# Patient Record
Sex: Female | Born: 1935 | Race: White | Hispanic: No | Marital: Married | Smoking: Never smoker
Health system: Southern US, Community
[De-identification: ages and names within clinical notes are randomized; demographics above are authoritative.]

## PROBLEM LIST (undated history)

## (undated) DIAGNOSIS — M1990 Unspecified osteoarthritis, unspecified site: Secondary | ICD-10-CM

## (undated) DIAGNOSIS — C801 Malignant (primary) neoplasm, unspecified: Secondary | ICD-10-CM

## (undated) DIAGNOSIS — N390 Urinary tract infection, site not specified: Secondary | ICD-10-CM

## (undated) HISTORY — PX: BREAST SURGERY: SHX581

## (undated) HISTORY — PX: OTHER SURGICAL HISTORY: SHX169

## (undated) HISTORY — PX: ABDOMINAL HYSTERECTOMY: SHX81

---

## 2006-11-04 ENCOUNTER — Ambulatory Visit: Payer: Self-pay | Admitting: Oncology

## 2011-06-22 HISTORY — PX: HIP FRACTURE SURGERY: SHX118

## 2012-06-21 HISTORY — PX: PARATHYROIDECTOMY: SHX19

## 2013-06-20 ENCOUNTER — Other Ambulatory Visit: Payer: Self-pay | Admitting: Physician Assistant

## 2013-06-20 NOTE — H&P (Signed)
TOTAL HIP ADMISSION H&P  Patient is admitted for left total hip arthroplasty.  Subjective:  Chief Complaint: left hip pain  HPI: Hannah Schwartz, 79 y.o. female, has a history of pain and functional disability in the left hip(s) due to arthritis and patient has failed non-surgical conservative treatments for greater than 12 weeks to include NSAID's and/or analgesics, use of assistive devices and activity modification.  Onset of symptoms was gradual starting 8 years ago with gradually worsening course since that time.The patient noted no past surgery on the left hip(s).  Patient currently rates pain in the left hip at 10 out of 10 with activity. Patient has night pain, worsening of pain with activity and weight bearing, trendelenberg gait, pain that interfers with activities of daily living and pain with passive range of motion. Patient has evidence of periarticular osteophytes and joint space narrowing by imaging studies. This condition presents safety issues increasing the risk of falls.  There is no current active infection.  There are no active problems to display for this patient.  No past medical history on file.  No past surgical history on file.   (Not in a hospital admission) Allergies not on file  History  Substance Use Topics  . Smoking status: Not on file  . Smokeless tobacco: Not on file  . Alcohol Use: Not on file    No family history on file.   Review of Systems  Constitutional: Negative.   HENT: Positive for hearing loss. Negative for tinnitus.   Eyes: Negative.   Respiratory: Negative.   Cardiovascular: Negative.   Gastrointestinal: Negative.   Genitourinary: Negative.   Musculoskeletal: Positive for joint pain.  Skin: Negative.   Neurological: Negative.  Negative for headaches.  Endo/Heme/Allergies: Negative.   Psychiatric/Behavioral: Negative.     Objective:  Physical Exam  Constitutional: She is oriented to person, place, and time. She appears well-developed  and well-nourished.  HENT:  Head: Normocephalic and atraumatic.  Eyes: EOM are normal. Pupils are equal, round, and reactive to light.  Neck: Normal range of motion. Neck supple.  Cardiovascular: Normal rate, regular rhythm, normal heart sounds and intact distal pulses.  Exam reveals no gallop and no friction rub.   No murmur heard. Respiratory: Effort normal and breath sounds normal. No respiratory distress. She has no wheezes. She has no rales.  GI: Soft. Bowel sounds are normal. She exhibits no distension. There is no tenderness. There is no rebound.  Musculoskeletal:  Positive log roll markedly decreased rotation left hip. No greater trochanteric soreness. She is neurovascularly intact distally.  Neurological: She is alert and oriented to person, place, and time.  Skin: Skin is warm and dry.  Psychiatric: She has a normal mood and affect. Her behavior is normal. Judgment and thought content normal.    Vital signs in last 24 hours: @VSRANGES @  Labs:   There is no height or weight on file to calculate BMI.   Imaging Review Plain radiographs demonstrate severe degenerative joint disease of the left hip(s). The bone quality appears to be fair for age and reported activity level.  Assessment/Plan:  End stage arthritis, left hip(s)  The patient history, physical examination, clinical judgement of the provider and imaging studies are consistent with end stage degenerative joint disease of the left hip(s) and total hip arthroplasty is deemed medically necessary. The treatment options including medical management, injection therapy, arthroscopy and arthroplasty were discussed at length. The risks and benefits of total hip arthroplasty were presented and reviewed. The risks  due to aseptic loosening, infection, stiffness, dislocation/subluxation,  thromboembolic complications and other imponderables were discussed.  The patient acknowledged the explanation, agreed to proceed with the plan  and consent was signed. Patient is being admitted for inpatient treatment for surgery, pain control, PT, OT, prophylactic antibiotics, VTE prophylaxis, progressive ambulation and ADL's and discharge planning.The patient is planning to be discharged home with home health services

## 2013-06-26 ENCOUNTER — Encounter (HOSPITAL_COMMUNITY)
Admission: RE | Admit: 2013-06-26 | Discharge: 2013-06-26 | Disposition: A | Discharge status: Home / Self Care | Payer: Medicare Other | Source: Ambulatory Visit | Attending: Physician Assistant | Admitting: Physician Assistant

## 2013-06-26 ENCOUNTER — Encounter (HOSPITAL_COMMUNITY): Payer: Self-pay

## 2013-06-26 ENCOUNTER — Encounter (HOSPITAL_COMMUNITY)
Admission: RE | Admit: 2013-06-26 | Discharge: 2013-06-26 | Disposition: A | Discharge status: Home / Self Care | Payer: Medicare Other | Source: Ambulatory Visit | Attending: Orthopedic Surgery | Admitting: Orthopedic Surgery

## 2013-06-26 DIAGNOSIS — Z01812 Encounter for preprocedural laboratory examination: Secondary | ICD-10-CM | POA: Insufficient documentation

## 2013-06-26 DIAGNOSIS — Z01810 Encounter for preprocedural cardiovascular examination: Secondary | ICD-10-CM | POA: Insufficient documentation

## 2013-06-26 DIAGNOSIS — Z01818 Encounter for other preprocedural examination: Secondary | ICD-10-CM | POA: Insufficient documentation

## 2013-06-26 DIAGNOSIS — Z01811 Encounter for preprocedural respiratory examination: Secondary | ICD-10-CM | POA: Insufficient documentation

## 2013-06-26 HISTORY — DX: Malignant (primary) neoplasm, unspecified: C80.1

## 2013-06-26 HISTORY — DX: Unspecified osteoarthritis, unspecified site: M19.90

## 2013-06-26 HISTORY — DX: Urinary tract infection, site not specified: N39.0

## 2013-06-26 LAB — SURGICAL PCR SCREEN
MRSA, PCR: NEGATIVE
Staphylococcus aureus: NEGATIVE

## 2013-06-26 LAB — CBC WITH DIFFERENTIAL/PLATELET
Basophils Absolute: 0 10*3/uL (ref 0.0–0.1)
Basophils Relative: 0 % (ref 0–1)
Eosinophils Absolute: 0.1 10*3/uL (ref 0.0–0.7)
Eosinophils Relative: 1 % (ref 0–5)
HEMATOCRIT: 37.3 % (ref 36.0–46.0)
Hemoglobin: 12.3 g/dL (ref 12.0–15.0)
LYMPHS PCT: 29 % (ref 12–46)
Lymphs Abs: 2.1 10*3/uL (ref 0.7–4.0)
MCH: 31.3 pg (ref 26.0–34.0)
MCHC: 33 g/dL (ref 30.0–36.0)
MCV: 94.9 fL (ref 78.0–100.0)
MONO ABS: 0.6 10*3/uL (ref 0.1–1.0)
Monocytes Relative: 8 % (ref 3–12)
Neutro Abs: 4.6 10*3/uL (ref 1.7–7.7)
Neutrophils Relative %: 63 % (ref 43–77)
Platelets: 303 10*3/uL (ref 150–400)
RBC: 3.93 MIL/uL (ref 3.87–5.11)
RDW: 13.2 % (ref 11.5–15.5)
WBC: 7.3 10*3/uL (ref 4.0–10.5)

## 2013-06-26 LAB — COMPREHENSIVE METABOLIC PANEL
ALT: 11 U/L (ref 0–35)
AST: 22 U/L (ref 0–37)
Albumin: 4.1 g/dL (ref 3.5–5.2)
Alkaline Phosphatase: 62 U/L (ref 39–117)
BUN: 12 mg/dL (ref 6–23)
CALCIUM: 9.8 mg/dL (ref 8.4–10.5)
CO2: 29 meq/L (ref 19–32)
Chloride: 99 mEq/L (ref 96–112)
Creatinine, Ser: 0.68 mg/dL (ref 0.50–1.10)
GFR calc non Af Amer: 80 mL/min — ABNORMAL LOW (ref >90)
GLUCOSE: 101 mg/dL — AB (ref 70–99)
Potassium: 5.9 mEq/L — ABNORMAL HIGH (ref 3.7–5.3)
Sodium: 141 mEq/L (ref 137–147)
Total Bilirubin: <0.2 mg/dL — ABNORMAL LOW (ref 0.3–1.2)
Total Protein: 7.3 g/dL (ref 6.0–8.3)

## 2013-06-26 LAB — URINALYSIS, ROUTINE W REFLEX MICROSCOPIC
Bilirubin Urine: NEGATIVE
Glucose, UA: NEGATIVE mg/dL
Ketones, ur: NEGATIVE mg/dL
Leukocytes, UA: NEGATIVE
Nitrite: NEGATIVE
PROTEIN: NEGATIVE mg/dL
Specific Gravity, Urine: 1.009 (ref 1.005–1.030)
Urobilinogen, UA: 0.2 mg/dL (ref 0.0–1.0)
pH: 6.5 (ref 5.0–8.0)

## 2013-06-26 LAB — URINE MICROSCOPIC-ADD ON

## 2013-06-26 LAB — APTT: aPTT: 28 seconds (ref 24–37)

## 2013-06-26 LAB — ABO/RH: ABO/RH(D): A POS

## 2013-06-26 LAB — TYPE AND SCREEN
ABO/RH(D): A POS
Antibody Screen: NEGATIVE

## 2013-06-26 LAB — PROTIME-INR
INR: 0.89 (ref 0.00–1.49)
PROTHROMBIN TIME: 11.9 s (ref 11.6–15.2)

## 2013-06-26 NOTE — Pre-Procedure Instructions (Addendum)
ALEENE SWANNER  06/26/2013   Your procedure is scheduled on:  07/04/13  Report to [facility] cone short stay admitting at 73 AM.  Call this number if you have problems the morning of surgery: [phone]   Remember:   Do not eat food or drink liquids after midnight.   Take these medicines the morning of surgery with A SIP OF WATER: none   Do not wear jewelry, make-up or nail polish.  Do not wear lotions, powders, or perfumes. You may wear deodorant.  Do not shave 48 hours prior to surgery. Men may shave face and neck.  Do not bring valuables to the hospital.  [facility] is not responsible                  for any belongings or valuables.               Contacts, dentures or bridgework may not be worn into surgery.  Leave suitcase in the car. After surgery it may be brought to your room.  For patients admitted to the hospital, discharge time is determined by your                treatment team.               Patients discharged the day of surgery will not be allowed to drive  home.  Name and phone number of your driver:   Special Instructions: Incentive Spirometry - Practice and bring it with you on the day of surgery. Shower using CHG 2 nights before surgery and the night before surgery.  If you shower the day of surgery use CHG.  Use special wash - you have one bottle of CHG for all showers.  You should use approximately 1/3 of the bottle for each shower.   Please read over the following fact sheets that you were given: Pain Booklet, Coughing and Deep Breathing, Blood Transfusion Information, Total Joint Packet, MRSA Information and Surgical Site Infection Prevention

## 2013-06-26 NOTE — Progress Notes (Signed)
req'd ekg, ?stress?echo from [place] cardiology dr Bettina Gavia  ?06

## 2013-06-27 ENCOUNTER — Encounter (HOSPITAL_COMMUNITY): Payer: Self-pay | Admitting: Vascular Surgery

## 2013-06-27 NOTE — Progress Notes (Signed)
Anesthesia Chart Review:  Patient is a 80 year old female scheduled for left THA on 07/04/13 by Dr. Kathryne Hitch.  History includes uterine cancer s/p hysterectomy, parathyroidectomy, arthritis. PCP is listed as Dr. Salvadore Oxford.  On 05/17/11 she had a negative stress echo at [facility]) in [place] (done for palpitations).  EKG on 06/26/13 on showed SR with first degree AVB, incomplete right BBB, cannot rule out anterior infarct (age undetermined).  I think her EKG appears stable when compared to prior EKGs from 02/25/11 and 05/01/12 [facility]).  CXR on 06/26/13 showed no active cardiopulmonary disease.  Preoperative labs noted.  K+ 5.9, Cr 0.68. There is no mention of specimen being hemolyzed.  CBC and PT/PTT WNL.  Urine culture is pending.  ISTAT on arrival to re-evaluate for hyperkalemia.  If hyperkalemia stable or improved then I would anticipate that she could proceed as planned. Dr. Debroah Loop PA Ria Comment notified.  [facility]/Anesthesiology Phone [phone] 06/28/2013 4:29 PM

## 2013-06-28 ENCOUNTER — Other Ambulatory Visit: Payer: Self-pay | Admitting: Physician Assistant

## 2013-06-29 LAB — URINE CULTURE: Colony Count: >=100000

## 2013-06-29 NOTE — Progress Notes (Signed)
Message left with the office of Dr Percell Miller and they Judeen Hammans) stated pt is going to be canceled due to need for cardiac workup, abnormal potassium and urine culture.

## 2013-07-04 ENCOUNTER — Encounter (HOSPITAL_COMMUNITY): Admission: RE | Payer: Self-pay | Source: Ambulatory Visit

## 2013-07-04 ENCOUNTER — Inpatient Hospital Stay (HOSPITAL_COMMUNITY): Admission: RE | Admit: 2013-07-04 | Payer: Medicare Other | Source: Ambulatory Visit | Admitting: Orthopedic Surgery

## 2013-07-04 SURGERY — ARTHROPLASTY, HIP, TOTAL,POSTERIOR APPROACH
Anesthesia: General | Site: Hip | Laterality: Left

## 2013-07-05 ENCOUNTER — Other Ambulatory Visit: Payer: Self-pay | Admitting: Physician Assistant

## 2013-07-06 ENCOUNTER — Other Ambulatory Visit: Payer: Self-pay | Admitting: Physician Assistant

## 2013-07-06 NOTE — H&P (Signed)
TOTAL HIP ADMISSION H&P  Patient is admitted for left total hip arthroplasty.  Subjective:  Chief Complaint: left hip pain  HPI: Hannah Schwartz, 80 y.o. female, has a history of pain and functional disability in the left hip(s) due to arthritis and patient has failed non-surgical conservative treatments for greater than 12 weeks to include NSAID's and/or analgesics, use of assistive devices and activity modification.  Onset of symptoms was gradual starting 5 years ago with gradually worsening course since that time.The patient noted no past surgery on the left hip(s).  Patient currently rates pain in the left hip at 6 out of 10 with activity. Patient has night pain, worsening of pain with activity and weight bearing, trendelenberg gait and pain that interfers with activities of daily living. Patient has evidence of periarticular osteophytes and joint space narrowing by imaging studies. This condition presents safety issues increasing the risk of falls.  There is no current active infection.  There are no active problems to display for this patient.  Past Medical History  Diagnosis Date  . UTI (lower urinary tract infection)   . Cancer     uterine  . Arthritis     Past Surgical History  Procedure Laterality Date  . Abdominal hysterectomy    . Parathyroidectomy  16  . Hip fracture surgery Right 13    split-scews  . Breast surgery Right     bx  . Hydraditaform       (Not in a hospital admission) Allergies  Allergen Reactions  . Bactrim [Sulfamethoxazole-Tmp Ds] Itching  . Citalopram Itching  . Sulfa Antibiotics   . Codeine Nausea Only    History  Substance Use Topics  . Smoking status: Never Smoker   . Smokeless tobacco: Not on file  . Alcohol Use: No    No family history on file.   Review of Systems  Constitutional: Negative.   HENT: Negative.   Eyes: Negative.   Respiratory: Negative.   Cardiovascular: Negative.   Gastrointestinal: Negative.   Genitourinary:  Negative.   Musculoskeletal: Positive for joint pain.  Skin: Negative.   Neurological: Negative.   Endo/Heme/Allergies: Negative.   Psychiatric/Behavioral: Negative.     Objective:  Physical Exam  Constitutional: She is oriented to person, place, and time. She appears well-developed and well-nourished.  HENT:  Head: Normocephalic and atraumatic.  Eyes: EOM are normal. Pupils are equal, round, and reactive to light.  Neck: Neck supple.  Cardiovascular: Normal rate, regular rhythm and normal heart sounds.  Exam reveals no gallop and no friction rub.   No murmur heard. Respiratory: Effort normal and breath sounds normal. No respiratory distress. She has no wheezes. She has no rales.  GI: Soft. Bowel sounds are normal.  Musculoskeletal:  Examination of her left hip reveals an antalgic gait, Trendelenburg component on the left.  Negative straight leg raise.  Positive log roll.  Markedly decreased rotation left hip.  No trochanteric soreness on the left.  She is neurovascularly intact distally.    Neurological: She is alert and oriented to person, place, and time.  Skin: Skin is warm and dry.  Psychiatric: She has a normal mood and affect. Her behavior is normal. Judgment and thought content normal.    Vital signs in last 24 hours: @VSRANGES@  Labs:   Estimated body mass index is 19.87 kg/(m^2) as calculated from the following:   Height as of 06/26/13: 5' 5" (1.651 m).   Weight as of 06/26/13: 54.159 kg (119 lb 6.4 oz).     Imaging Review Plain radiographs demonstrate severe degenerative joint disease of the left hip(s). The bone quality appears to be fair for age and reported activity level.  Assessment/Plan:  End stage arthritis, left hip(s)  The patient history, physical examination, clinical judgement of the provider and imaging studies are consistent with end stage degenerative joint disease of the left hip(s) and total hip arthroplasty is deemed medically necessary. The  treatment options including medical management, injection therapy, arthroscopy and arthroplasty were discussed at length. The risks and benefits of total hip arthroplasty were presented and reviewed. The risks due to aseptic loosening, infection, stiffness, dislocation/subluxation,  thromboembolic complications and other imponderables were discussed.  The patient acknowledged the explanation, agreed to proceed with the plan and consent was signed. Patient is being admitted for inpatient treatment for surgery, pain control, PT, OT, prophylactic antibiotics, VTE prophylaxis, progressive ambulation and ADL's and discharge planning.The patient is planning to be discharged home with home health services

## 2013-07-10 ENCOUNTER — Other Ambulatory Visit: Payer: Self-pay | Admitting: Physician Assistant

## 2013-07-17 ENCOUNTER — Encounter (HOSPITAL_COMMUNITY): Payer: Self-pay

## 2013-07-17 ENCOUNTER — Other Ambulatory Visit (HOSPITAL_COMMUNITY): Payer: Medicare Other

## 2013-07-17 ENCOUNTER — Encounter (HOSPITAL_COMMUNITY)
Admission: RE | Admit: 2013-07-17 | Discharge: 2013-07-17 | Disposition: A | Discharge status: Home / Self Care | Payer: Medicare Other | Source: Ambulatory Visit | Attending: Orthopedic Surgery | Admitting: Orthopedic Surgery

## 2013-07-17 DIAGNOSIS — Z01812 Encounter for preprocedural laboratory examination: Secondary | ICD-10-CM | POA: Insufficient documentation

## 2013-07-17 DIAGNOSIS — Z01818 Encounter for other preprocedural examination: Secondary | ICD-10-CM | POA: Insufficient documentation

## 2013-07-17 LAB — URINALYSIS, ROUTINE W REFLEX MICROSCOPIC
Bilirubin Urine: NEGATIVE
GLUCOSE, UA: NEGATIVE mg/dL
Ketones, ur: NEGATIVE mg/dL
Leukocytes, UA: NEGATIVE
Nitrite: NEGATIVE
Protein, ur: NEGATIVE mg/dL
SPECIFIC GRAVITY, URINE: 1.013 (ref 1.005–1.030)
UROBILINOGEN UA: 0.2 mg/dL (ref 0.0–1.0)
pH: 7.5 (ref 5.0–8.0)

## 2013-07-17 LAB — TYPE AND SCREEN
ABO/RH(D): A POS
Antibody Screen: NEGATIVE

## 2013-07-17 LAB — COMPREHENSIVE METABOLIC PANEL
ALK PHOS: 69 U/L (ref 39–117)
ALT: 14 U/L (ref 0–35)
AST: 18 U/L (ref 0–37)
Albumin: 4 g/dL (ref 3.5–5.2)
BUN: 13 mg/dL (ref 6–23)
CHLORIDE: 99 meq/L (ref 96–112)
CO2: 28 mEq/L (ref 19–32)
Calcium: 9.7 mg/dL (ref 8.4–10.5)
Creatinine, Ser: 0.69 mg/dL (ref 0.50–1.10)
GFR calc non Af Amer: 80 mL/min — ABNORMAL LOW (ref >90)
GLUCOSE: 94 mg/dL (ref 70–99)
Potassium: 4.9 mEq/L (ref 3.7–5.3)
Sodium: 141 mEq/L (ref 137–147)
Total Bilirubin: <0.2 mg/dL — ABNORMAL LOW (ref 0.3–1.2)
Total Protein: 7.5 g/dL (ref 6.0–8.3)

## 2013-07-17 LAB — CBC WITH DIFFERENTIAL/PLATELET
Basophils Absolute: 0 10*3/uL (ref 0.0–0.1)
Basophils Relative: 1 % (ref 0–1)
EOS PCT: 1 % (ref 0–5)
Eosinophils Absolute: 0.1 10*3/uL (ref 0.0–0.7)
HCT: 36.1 % (ref 36.0–46.0)
Hemoglobin: 12.3 g/dL (ref 12.0–15.0)
LYMPHS ABS: 2 10*3/uL (ref 0.7–4.0)
LYMPHS PCT: 31 % (ref 12–46)
MCH: 31.6 pg (ref 26.0–34.0)
MCHC: 34.1 g/dL (ref 30.0–36.0)
MCV: 92.8 fL (ref 78.0–100.0)
Monocytes Absolute: 0.4 10*3/uL (ref 0.1–1.0)
Monocytes Relative: 7 % (ref 3–12)
NEUTROS PCT: 61 % (ref 43–77)
Neutro Abs: 3.9 10*3/uL (ref 1.7–7.7)
PLATELETS: 279 10*3/uL (ref 150–400)
RBC: 3.89 MIL/uL (ref 3.87–5.11)
RDW: 12.7 % (ref 11.5–15.5)
WBC: 6.3 10*3/uL (ref 4.0–10.5)

## 2013-07-17 LAB — URINE MICROSCOPIC-ADD ON

## 2013-07-17 LAB — APTT: aPTT: 29 seconds (ref 24–37)

## 2013-07-17 LAB — PROTIME-INR
INR: 0.97 (ref 0.00–1.49)
Prothrombin Time: 12.7 seconds (ref 11.6–15.2)

## 2013-07-17 NOTE — Pre-Procedure Instructions (Signed)
Hannah Schwartz  07/17/2013   Your procedure is scheduled on:  Wednesday February 4 th at 1330 Pm  Report to [facility] Main Entrance "A"at  ![phone].  Call this number if you have problems the morning of surgery: [phone]   Remember:   Do not eat food or drink liquids after midnight.   Take these medicines the morning of surgery with A SIP OF WATER: Tylenol if needed Stop Aspirin, Vitamins, Herbal medication, and NSAIDS (Naproxen, Ibuprofen, Advil)  Do not wear jewelry, make-up or nail polish.  Do not wear lotions, powders, or perfumes. You may wear deodorant.  Do not shave 48 hours prior to surgery. Men may shave face and neck.  Do not bring valuables to the hospital.  [facility] is not responsible  for any belongings or valuables.               Contacts, dentures or bridgework may not be worn into surgery.  Leave suitcase in the car. After surgery it may be brought to your room.  For patients admitted to the hospital, discharge time is determined by your  treatment team.               Patients discharged the day of surgery will not be allowed to drive home.    Special Instructions: Incentive Spirometry - Practice and bring it with you on the day of surgery. Shower using CHG 2 nights before surgery and the night before surgery.  If you shower the day of surgery use CHG.  Use special wash - you have one bottle of CHG for all showers.  You should use approximately 1/3 of the bottle for each shower.   Please read over the following fact sheets that you were given: Pain Booklet, Coughing and Deep Breathing, Blood Transfusion Information, MRSA Information and Surgical Site Infection Prevention

## 2013-07-18 LAB — URINE CULTURE

## 2013-07-18 NOTE — Progress Notes (Signed)
Anesthesia Chart Review: Patient is a 80 year old female scheduled for left THA on 07/25/13 by Dr. Kathryne Hitch. Procedure was initially scheduled for 07/04/13, but was postponed (? Due to hyperkalemia and apparently referral to cardiology).    History includes uterine cancer s/p hysterectomy, parathyroidectomy, arthritis. PCP is listed as Dr. Salvadore Oxford. She was seen by cardiologist Dr. Jimmie Molly with [facility] on 07/04/13 and felt to be low risk from a cardiac standpoint.  PRN cardiology follow-up was recommended.  Echo on 02/22/12 [facility]) showed grossly normal LV size and systolic function, mild AV sclerosis without stenosis, trace to mild AR, trace MR, trace TR.  On 05/17/11 she had a negative stress echo at [facility]) in [place] (done for palpitations).   EKG on 07/04/13 on showed SR with first degree AVB, RSR (V1), non-diagnostic.   CXR on 06/26/13 showed no active cardiopulmonary disease.   Preoperative labs noted. Urine culture is still pending.  Anticipate that she can proceed as planned.  [facility] Stay [facility]/Anesthesiology Phone [phone] 07/18/2013 11:39 AM

## 2013-07-24 MED ORDER — CHLORHEXIDINE GLUCONATE 4 % EX LIQD: 60.0000 mL | Freq: Once | CUTANEOUS | Status: DC

## 2013-07-24 MED ORDER — LACTATED RINGERS IV SOLN
INTRAVENOUS | Status: DC
  Administered 2013-07-25 (×2): via INTRAVENOUS

## 2013-07-24 MED ORDER — LACTATED RINGERS IV SOLN: INTRAVENOUS | Status: DC

## 2013-07-24 MED ORDER — CEFAZOLIN SODIUM-DEXTROSE 2-3 GM-% IV SOLR: 2.0000 g | INTRAVENOUS | Status: DC

## 2013-07-24 MED ORDER — CEFAZOLIN SODIUM-DEXTROSE 2-3 GM-% IV SOLR
2.0000 g | INTRAVENOUS | Status: AC
  Administered 2013-07-25: 2 g via INTRAVENOUS
  Filled 2013-07-24: qty 50

## 2013-07-24 NOTE — Progress Notes (Signed)
Message left for pt. Regarding time change,need to arrive at 11:00AM. Requested pt. Call back to confirm message received.

## 2013-07-25 ENCOUNTER — Inpatient Hospital Stay (HOSPITAL_COMMUNITY): Payer: Medicare Other | Admitting: Anesthesiology

## 2013-07-25 ENCOUNTER — Inpatient Hospital Stay (HOSPITAL_COMMUNITY): Payer: Medicare Other

## 2013-07-25 ENCOUNTER — Encounter (HOSPITAL_COMMUNITY)
Admission: RE | Disposition: A | Discharge status: Home Health | Payer: Self-pay | Source: Ambulatory Visit | Attending: Orthopedic Surgery

## 2013-07-25 ENCOUNTER — Encounter (HOSPITAL_COMMUNITY): Payer: Medicare Other | Admitting: Vascular Surgery

## 2013-07-25 ENCOUNTER — Inpatient Hospital Stay (HOSPITAL_COMMUNITY)
Admission: RE | Admit: 2013-07-25 | Discharge: 2013-07-27 | DRG: 470 | Disposition: A | Discharge status: Home Health | Payer: Medicare Other | Source: Ambulatory Visit | Attending: Orthopedic Surgery | Admitting: Orthopedic Surgery

## 2013-07-25 ENCOUNTER — Encounter (HOSPITAL_COMMUNITY): Payer: Self-pay | Admitting: *Deleted

## 2013-07-25 DIAGNOSIS — Z888 Allergy status to other drugs, medicaments and biological substances status: Secondary | ICD-10-CM

## 2013-07-25 DIAGNOSIS — M1612 Unilateral primary osteoarthritis, left hip: Secondary | ICD-10-CM

## 2013-07-25 DIAGNOSIS — Z8542 Personal history of malignant neoplasm of other parts of uterus: Secondary | ICD-10-CM

## 2013-07-25 DIAGNOSIS — Z882 Allergy status to sulfonamides status: Secondary | ICD-10-CM

## 2013-07-25 DIAGNOSIS — Z7982 Long term (current) use of aspirin: Secondary | ICD-10-CM

## 2013-07-25 DIAGNOSIS — Z79899 Other long term (current) drug therapy: Secondary | ICD-10-CM

## 2013-07-25 DIAGNOSIS — M1610 Unilateral primary osteoarthritis, unspecified hip: Principal | ICD-10-CM | POA: Diagnosis present

## 2013-07-25 HISTORY — PX: TOTAL HIP ARTHROPLASTY: SHX124

## 2013-07-25 SURGERY — ARTHROPLASTY, HIP, TOTAL,POSTERIOR APPROACH
Anesthesia: General | Laterality: Left

## 2013-07-25 MED ORDER — FENTANYL CITRATE 0.05 MG/ML IJ SOLN
INTRAMUSCULAR | Status: DC | PRN
  Administered 2013-07-25 (×5): 50 ug via INTRAVENOUS

## 2013-07-25 MED ORDER — ZOLPIDEM TARTRATE 5 MG PO TABS: 5.0000 mg | ORAL_TABLET | Freq: Every evening | ORAL | Status: DC | PRN

## 2013-07-25 MED ORDER — MENTHOL 3 MG MT LOZG: 1.0000 | LOZENGE | OROMUCOSAL | Status: DC | PRN

## 2013-07-25 MED ORDER — PROPOFOL 10 MG/ML IV BOLUS
INTRAVENOUS | Status: DC | PRN
  Administered 2013-07-25: 100 mg via INTRAVENOUS

## 2013-07-25 MED ORDER — ONDANSETRON HCL 4 MG/2ML IJ SOLN: 4.0000 mg | Freq: Four times a day (QID) | INTRAMUSCULAR | Status: DC | PRN

## 2013-07-25 MED ORDER — ONDANSETRON HCL 4 MG PO TABS
4.0000 mg | ORAL_TABLET | Freq: Four times a day (QID) | ORAL | Status: DC | PRN
Start: 2013-07-25 — End: 2013-07-27

## 2013-07-25 MED ORDER — ACETAMINOPHEN 325 MG PO TABS: 650.0000 mg | ORAL_TABLET | Freq: Four times a day (QID) | ORAL | Status: DC | PRN

## 2013-07-25 MED ORDER — ASPIRIN EC 325 MG PO TBEC: 325.0000 mg | DELAYED_RELEASE_TABLET | Freq: Every day | ORAL | Status: DC

## 2013-07-25 MED ORDER — FENTANYL CITRATE 0.05 MG/ML IJ SOLN
INTRAMUSCULAR | Status: AC
  Filled 2013-07-25: qty 2

## 2013-07-25 MED ORDER — CHLORHEXIDINE GLUCONATE 4 % EX LIQD: 60.0000 mL | Freq: Once | CUTANEOUS | Status: DC

## 2013-07-25 MED ORDER — POLYETHYLENE GLYCOL 3350 17 G PO PACK
17.0000 g | PACK | Freq: Every day | ORAL | Status: DC | PRN
  Administered 2013-07-27: 17 g via ORAL
  Filled 2013-07-25: qty 1

## 2013-07-25 MED ORDER — GLYCOPYRROLATE 0.2 MG/ML IJ SOLN
INTRAMUSCULAR | Status: AC
  Filled 2013-07-25: qty 2

## 2013-07-25 MED ORDER — SODIUM CHLORIDE 0.9 % IJ SOLN
INTRAMUSCULAR | Status: DC | PRN
  Administered 2013-07-25: 14:00:00

## 2013-07-25 MED ORDER — SODIUM CHLORIDE 0.9 % IJ SOLN
INTRAMUSCULAR | Status: AC
  Filled 2013-07-25: qty 10

## 2013-07-25 MED ORDER — BUPIVACAINE LIPOSOME 1.3 % IJ SUSP
20.0000 mL | Freq: Once | INTRAMUSCULAR | Status: DC
  Filled 2013-07-25: qty 20

## 2013-07-25 MED ORDER — DOCUSATE SODIUM 100 MG PO CAPS
100.0000 mg | ORAL_CAPSULE | Freq: Two times a day (BID) | ORAL | Status: DC
  Administered 2013-07-25 – 2013-07-27 (×4): 100 mg via ORAL
  Filled 2013-07-25 (×6): qty 1

## 2013-07-25 MED ORDER — METHOCARBAMOL 500 MG PO TABS
ORAL_TABLET | ORAL | Status: AC
  Filled 2013-07-25: qty 1

## 2013-07-25 MED ORDER — PROPOFOL 10 MG/ML IV BOLUS
INTRAVENOUS | Status: AC
  Filled 2013-07-25: qty 20

## 2013-07-25 MED ORDER — DIPHENHYDRAMINE HCL 12.5 MG/5ML PO ELIX
12.5000 mg | ORAL_SOLUTION | ORAL | Status: DC | PRN
  Administered 2013-07-25 – 2013-07-27 (×2): 25 mg via ORAL
  Filled 2013-07-25 (×2): qty 10

## 2013-07-25 MED ORDER — DEXAMETHASONE 6 MG PO TABS
10.0000 mg | ORAL_TABLET | Freq: Three times a day (TID) | ORAL | Status: AC
  Administered 2013-07-25 – 2013-07-26 (×3): 10 mg via ORAL
  Filled 2013-07-25 (×3): qty 1

## 2013-07-25 MED ORDER — ROCURONIUM BROMIDE 50 MG/5ML IV SOLN
INTRAVENOUS | Status: AC
  Filled 2013-07-25: qty 1

## 2013-07-25 MED ORDER — PHENYLEPHRINE 40 MCG/ML (10ML) SYRINGE FOR IV PUSH (FOR BLOOD PRESSURE SUPPORT)
PREFILLED_SYRINGE | INTRAVENOUS | Status: AC
  Filled 2013-07-25: qty 10

## 2013-07-25 MED ORDER — ARTIFICIAL TEARS OP OINT
TOPICAL_OINTMENT | OPHTHALMIC | Status: DC | PRN
  Administered 2013-07-25: 1 via OPHTHALMIC

## 2013-07-25 MED ORDER — METOCLOPRAMIDE HCL 5 MG/ML IJ SOLN: 5.0000 mg | Freq: Three times a day (TID) | INTRAMUSCULAR | Status: DC | PRN

## 2013-07-25 MED ORDER — HYDROMORPHONE HCL PF 1 MG/ML IJ SOLN
0.5000 mg | INTRAMUSCULAR | Status: DC | PRN
  Administered 2013-07-25: 0.5 mg via INTRAVENOUS

## 2013-07-25 MED ORDER — METHOCARBAMOL 100 MG/ML IJ SOLN
500.0000 mg | Freq: Four times a day (QID) | INTRAVENOUS | Status: DC | PRN
  Filled 2013-07-25: qty 5

## 2013-07-25 MED ORDER — BUPIVACAINE HCL 0.5 % IJ SOLN
INTRAMUSCULAR | Status: DC | PRN
  Administered 2013-07-25: 10 mL

## 2013-07-25 MED ORDER — PHENYLEPHRINE HCL 10 MG/ML IJ SOLN
INTRAMUSCULAR | Status: DC | PRN
  Administered 2013-07-25: 160 ug via INTRAVENOUS
  Administered 2013-07-25 (×3): 80 ug via INTRAVENOUS

## 2013-07-25 MED ORDER — HYDROCODONE-ACETAMINOPHEN 5-325 MG PO TABS: 1.0000 | ORAL_TABLET | ORAL | Status: DC | PRN

## 2013-07-25 MED ORDER — ROCURONIUM BROMIDE 100 MG/10ML IV SOLN
INTRAVENOUS | Status: DC | PRN
  Administered 2013-07-25: 40 mg via INTRAVENOUS
  Administered 2013-07-25: 10 mg via INTRAVENOUS

## 2013-07-25 MED ORDER — ACETAMINOPHEN 650 MG RE SUPP: 650.0000 mg | Freq: Four times a day (QID) | RECTAL | Status: DC | PRN

## 2013-07-25 MED ORDER — LIDOCAINE HCL (CARDIAC) 20 MG/ML IV SOLN
INTRAVENOUS | Status: AC
  Filled 2013-07-25: qty 5

## 2013-07-25 MED ORDER — GLYCOPYRROLATE 0.2 MG/ML IJ SOLN
INTRAMUSCULAR | Status: DC | PRN
  Administered 2013-07-25: 0.4 mg via INTRAVENOUS

## 2013-07-25 MED ORDER — BISACODYL 10 MG RE SUPP
10.0000 mg | Freq: Every day | RECTAL | Status: DC | PRN
Start: 2013-07-25 — End: 2013-07-27

## 2013-07-25 MED ORDER — ONDANSETRON HCL 4 MG/2ML IJ SOLN
INTRAMUSCULAR | Status: DC | PRN
Start: 2013-07-25 — End: 2013-07-25
  Administered 2013-07-25: 4 mg via INTRAVENOUS

## 2013-07-25 MED ORDER — FLEET ENEMA 7-19 GM/118ML RE ENEM: 1.0000 | ENEMA | Freq: Once | RECTAL | Status: AC | PRN

## 2013-07-25 MED ORDER — SODIUM CHLORIDE 0.9 % IR SOLN
Status: DC | PRN
  Administered 2013-07-25: 1000 mL

## 2013-07-25 MED ORDER — FENTANYL CITRATE 0.05 MG/ML IJ SOLN
25.0000 ug | INTRAMUSCULAR | Status: DC | PRN
  Administered 2013-07-25 (×2): 50 ug via INTRAVENOUS
  Administered 2013-07-25 (×2): 25 ug via INTRAVENOUS

## 2013-07-25 MED ORDER — OXYCODONE-ACETAMINOPHEN 5-325 MG PO TABS
1.0000 | ORAL_TABLET | ORAL | Status: DC | PRN
  Administered 2013-07-26 (×4): 1 via ORAL
  Administered 2013-07-27 (×3): 2 via ORAL
  Filled 2013-07-25 (×3): qty 2
  Filled 2013-07-25: qty 1
  Filled 2013-07-25 (×2): qty 2
  Filled 2013-07-25: qty 1

## 2013-07-25 MED ORDER — ARTIFICIAL TEARS OP OINT
TOPICAL_OINTMENT | OPHTHALMIC | Status: AC
  Filled 2013-07-25: qty 3.5

## 2013-07-25 MED ORDER — CEFAZOLIN SODIUM 1-5 GM-% IV SOLN
1.0000 g | Freq: Four times a day (QID) | INTRAVENOUS | Status: AC
  Administered 2013-07-25 – 2013-07-26 (×2): 1 g via INTRAVENOUS
  Filled 2013-07-25 (×2): qty 50

## 2013-07-25 MED ORDER — POTASSIUM CHLORIDE IN NACL 20-0.9 MEQ/L-% IV SOLN
INTRAVENOUS | Status: DC
  Administered 2013-07-26: 07:00:00 via INTRAVENOUS
  Filled 2013-07-25 (×3): qty 1000

## 2013-07-25 MED ORDER — FENTANYL CITRATE 0.05 MG/ML IJ SOLN
INTRAMUSCULAR | Status: AC
  Filled 2013-07-25: qty 5

## 2013-07-25 MED ORDER — METOCLOPRAMIDE HCL 10 MG PO TABS: 5.0000 mg | ORAL_TABLET | Freq: Three times a day (TID) | ORAL | Status: DC | PRN

## 2013-07-25 MED ORDER — METHOCARBAMOL 500 MG PO TABS
500.0000 mg | ORAL_TABLET | Freq: Four times a day (QID) | ORAL | Status: DC | PRN
  Administered 2013-07-25 – 2013-07-27 (×2): 500 mg via ORAL
  Filled 2013-07-25: qty 1

## 2013-07-25 MED ORDER — PHENOL 1.4 % MT LIQD: 1.0000 | OROMUCOSAL | Status: DC | PRN

## 2013-07-25 MED ORDER — HYDROMORPHONE HCL PF 1 MG/ML IJ SOLN
INTRAMUSCULAR | Status: AC
  Filled 2013-07-25: qty 1

## 2013-07-25 MED ORDER — LIDOCAINE HCL (CARDIAC) 20 MG/ML IV SOLN
INTRAVENOUS | Status: DC | PRN
  Administered 2013-07-25: 40 mg via INTRAVENOUS

## 2013-07-25 MED ORDER — NEOSTIGMINE METHYLSULFATE 1 MG/ML IJ SOLN
INTRAMUSCULAR | Status: DC | PRN
  Administered 2013-07-25: 3 mg via INTRAVENOUS

## 2013-07-25 MED ORDER — METHOCARBAMOL 500 MG PO TABS: 500.0000 mg | ORAL_TABLET | Freq: Four times a day (QID) | ORAL | Status: DC

## 2013-07-25 MED ORDER — ASPIRIN EC 325 MG PO TBEC
325.0000 mg | DELAYED_RELEASE_TABLET | Freq: Every day | ORAL | Status: DC
  Administered 2013-07-26 – 2013-07-27 (×2): 325 mg via ORAL
  Filled 2013-07-25 (×3): qty 1

## 2013-07-25 MED ORDER — ONDANSETRON HCL 4 MG/2ML IJ SOLN
INTRAMUSCULAR | Status: AC
  Filled 2013-07-25: qty 2

## 2013-07-25 MED ORDER — DEXAMETHASONE SODIUM PHOSPHATE 10 MG/ML IJ SOLN
10.0000 mg | Freq: Three times a day (TID) | INTRAMUSCULAR | Status: AC
  Filled 2013-07-25 (×3): qty 1

## 2013-07-25 MED ORDER — ONDANSETRON HCL 4 MG PO TABS: 4.0000 mg | ORAL_TABLET | Freq: Three times a day (TID) | ORAL | Status: DC | PRN

## 2013-07-25 MED ORDER — EPHEDRINE SULFATE 50 MG/ML IJ SOLN
INTRAMUSCULAR | Status: AC
  Filled 2013-07-25: qty 1

## 2013-07-25 MED ORDER — DROPERIDOL 2.5 MG/ML IJ SOLN: 0.6250 mg | INTRAMUSCULAR | Status: DC | PRN

## 2013-07-25 SURGICAL SUPPLY — 62 items
BLADE SAW SAG 73X25 THK (BLADE) ×2
BLADE SAW SGTL 73X25 THK (BLADE) ×1 IMPLANT
BRUSH FEMORAL CANAL (MISCELLANEOUS) IMPLANT
CLOSURE STERI-STRIP 1/2X4 (GAUZE/BANDAGES/DRESSINGS) ×1
CLSR STERI-STRIP ANTIMIC 1/2X4 (GAUZE/BANDAGES/DRESSINGS) ×2 IMPLANT
COVER BACK TABLE 24X17X13 BIG (DRAPES) IMPLANT
COVER SURGICAL LIGHT HANDLE (MISCELLANEOUS) ×3 IMPLANT
DRAPE INCISE IOBAN 66X45 STRL (DRAPES) IMPLANT
DRAPE ORTHO SPLIT 77X108 STRL (DRAPES) ×6
DRAPE SURG ORHT 6 SPLT 77X108 (DRAPES) ×2 IMPLANT
DRAPE U-SHAPE 47X51 STRL (DRAPES) ×3 IMPLANT
DRILL BIT 7/64X5 (BIT) ×3 IMPLANT
DRSG AQUACEL AG ADV 3.5X10 (GAUZE/BANDAGES/DRESSINGS) ×2 IMPLANT
DRSG PAD ABDOMINAL 8X10 ST (GAUZE/BANDAGES/DRESSINGS) ×6 IMPLANT
DURAPREP 26ML APPLICATOR (WOUND CARE) ×3 IMPLANT
ELECT BLADE 6.5 EXT (BLADE) IMPLANT
ELECT CAUTERY BLADE 6.4 (BLADE) ×3 IMPLANT
ELECT REM PT RETURN 9FT ADLT (ELECTROSURGICAL) ×3
ELECTRODE REM PT RTRN 9FT ADLT (ELECTROSURGICAL) ×1 IMPLANT
EVACUATOR 1/8 PVC DRAIN (DRAIN) IMPLANT
FACESHIELD LNG OPTICON STERILE (SAFETY) ×6 IMPLANT
GAUZE XEROFORM 5X9 LF (GAUZE/BANDAGES/DRESSINGS) ×3 IMPLANT
GLOVE BIO SURGEON STRL SZ 6.5 (GLOVE) ×4 IMPLANT
GLOVE BIO SURGEONS STRL SZ 6.5 (GLOVE) ×2
GLOVE BIOGEL PI IND STRL 7.0 (GLOVE) ×2 IMPLANT
GLOVE BIOGEL PI INDICATOR 7.0 (GLOVE) ×6
GLOVE ECLIPSE 6.5 STRL STRAW (GLOVE) ×2 IMPLANT
GLOVE ORTHO TXT STRL SZ7.5 (GLOVE) ×12 IMPLANT
GOWN PREVENTION PLUS XLARGE (GOWN DISPOSABLE) ×6 IMPLANT
HANDPIECE INTERPULSE COAX TIP (DISPOSABLE)
HIP/VIT E LINR/CERM HD LEV 1B ×2 IMPLANT
KIT BASIN OR (CUSTOM PROCEDURE TRAY) ×3 IMPLANT
KIT ROOM TURNOVER OR (KITS) ×3 IMPLANT
MANIFOLD NEPTUNE II (INSTRUMENTS) ×3 IMPLANT
NDL HYPO 25GX1X1/2 BEV (NEEDLE) ×1 IMPLANT
NEEDLE HYPO 25GX1X1/2 BEV (NEEDLE) ×3 IMPLANT
NS IRRIG 1000ML POUR BTL (IV SOLUTION) ×3 IMPLANT
PACK TOTAL JOINT (CUSTOM PROCEDURE TRAY) ×3 IMPLANT
PAD ARMBOARD 7.5X6 YLW CONV (MISCELLANEOUS) ×6 IMPLANT
PENCIL BUTTON HOLSTER BLD 10FT (ELECTRODE) ×2 IMPLANT
PILLOW ABDUCTION HIP (SOFTGOODS) ×3 IMPLANT
PRESSURIZER FEMORAL UNIV (MISCELLANEOUS) IMPLANT
RETRIEVER SUT HEWSON (MISCELLANEOUS) ×3 IMPLANT
SET HNDPC FAN SPRY TIP SCT (DISPOSABLE) IMPLANT
SPONGE GAUZE 4X4 12PLY (GAUZE/BANDAGES/DRESSINGS) ×3 IMPLANT
SPONGE LAP 4X18 X RAY DECT (DISPOSABLE) IMPLANT
STAPLER VISISTAT 35W (STAPLE) ×3 IMPLANT
SUCTION FRAZIER TIP 10 FR DISP (SUCTIONS) ×6 IMPLANT
SUT FIBERWIRE #2 38 REV NDL BL (SUTURE) ×9
SUT MNCRL AB 4-0 PS2 18 (SUTURE) ×2 IMPLANT
SUT VIC AB 1 CTX 27 (SUTURE) ×4 IMPLANT
SUT VIC AB 1 CTX 36 (SUTURE) ×12
SUT VIC AB 1 CTX36XBRD ANBCTR (SUTURE) ×4 IMPLANT
SUT VIC AB 2-0 CT1 27 (SUTURE) ×3
SUT VIC AB 2-0 CT1 TAPERPNT 27 (SUTURE) ×2 IMPLANT
SUTURE FIBERWR#2 38 REV NDL BL (SUTURE) ×3 IMPLANT
SYR 50ML LL SCALE MARK (SYRINGE) ×3 IMPLANT
SYR CONTROL 10ML LL (SYRINGE) ×3 IMPLANT
TOWEL OR 17X24 6PK STRL BLUE (TOWEL DISPOSABLE) ×3 IMPLANT
TOWEL OR 17X26 10 PK STRL BLUE (TOWEL DISPOSABLE) ×3 IMPLANT
TOWER CARTRIDGE SMART MIX (DISPOSABLE) IMPLANT
WATER STERILE IRR 1000ML POUR (IV SOLUTION) ×12 IMPLANT

## 2013-07-25 NOTE — Plan of Care (Signed)
Problem: Consults Goal: Diagnosis- Total Joint Replacement Outcome: Completed/Met Date Met:  07/25/13 Primary Total Hip-Left

## 2013-07-25 NOTE — Discharge Instructions (Signed)
Total Hip Replacement Care After Refer to this sheet in the next few weeks. These discharge instructions provide you with general information on caring for yourself after you leave the hospital. Your caregiver may also give you specific instructions. Your treatment has been planned according to the most current medical practices available, but unavoidable complications sometimes occur. If you have any problems or questions after discharge, please call your caregiver.  HOME CARE INSTRUCTIONS  You may resume a normal diet and activities as directed.  Perform exercises as directed.  Posterior total hip precautions with weight bearing as tolerated walking You will receive physical therapy daily  Take showers instead of baths until informed otherwise.    Please wash whole leg including wound with soap and water  Change bandages (dressings)daily It is OK to take over-the-counter tylenol in addition to the tramadol for pain, discomfort, or fever. Eat a well-balanced diet.  Avoid lifting or driving until you are instructed otherwise.  Make an appointment to see your caregiver for stitches (suture) or staple removal as directed.   SEEK MEDICAL CARE IF: You have swelling of your calf or leg.  You develop shortness of breath or chest pain.  You have redness, swelling, or increasing pain in the wound.  There is pus or any unusual drainage coming from the surgical site.  You notice a bad smell coming from the surgical site or dressing.  The surgical site breaks open after sutures or staples have been removed.  There is persistent bleeding from the suture or staple line.  You are getting worse or are not improving.  You have any other questions or concerns.  SEEK IMMEDIATE MEDICAL CARE IF:  You have a fever.  You develop a rash.  You have difficulty breathing.  You develop any reaction or side effects to medicines given.  Your knee motion is decreasing rather than improving.  MAKE SURE YOU:    Understand these instructions.  Will watch your condition.  Will get help right away if you are not doing well or get worse.

## 2013-07-25 NOTE — Preoperative (Signed)
Beta Blockers   Reason not to administer Beta Blockers:Not Applicable 

## 2013-07-25 NOTE — Interval H&P Note (Signed)
History and Physical Interval Note:  07/25/2013 8:19 AM  Hannah Schwartz  has presented today for surgery, with the diagnosis of DJD LT HIP  The various methods of treatment have been discussed with the patient and family. After consideration of risks, benefits and other options for treatment, the patient has consented to  Procedure(s): TOTAL HIP ARTHROPLASTY (Left) as a surgical intervention .  The patient's history has been reviewed, patient examined, no change in status, stable for surgery.  I have reviewed the patient's chart and labs.  Questions were answered to the patient's satisfaction.     [name] F

## 2013-07-25 NOTE — Transfer of Care (Signed)
Immediate Anesthesia Transfer of Care Note  Patient: Hannah Schwartz  Procedure(s) Performed: Procedure(s): TOTAL HIP ARTHROPLASTY (Left)  Patient Location: PACU  Anesthesia Type:General  Level of Consciousness: awake, alert  and oriented  Airway & Oxygen Therapy: Patient Spontanous Breathing and Patient connected to nasal cannula oxygen  Post-op Assessment: Report given to PACU RN and Patient moving all extremities X 4  Post vital signs: Reviewed and stable  Complications: No apparent anesthesia complications

## 2013-07-25 NOTE — H&P (View-Only) (Signed)
TOTAL HIP ADMISSION H&P  Patient is admitted for left total hip arthroplasty.  Subjective:  Chief Complaint: left hip pain  HPI: Hannah Schwartz, 80 y.o. female, has a history of pain and functional disability in the left hip(s) due to arthritis and patient has failed non-surgical conservative treatments for greater than 12 weeks to include NSAID's and/or analgesics, use of assistive devices and activity modification.  Onset of symptoms was gradual starting 5 years ago with gradually worsening course since that time.The patient noted no past surgery on the left hip(s).  Patient currently rates pain in the left hip at 6 out of 10 with activity. Patient has night pain, worsening of pain with activity and weight bearing, trendelenberg gait and pain that interfers with activities of daily living. Patient has evidence of periarticular osteophytes and joint space narrowing by imaging studies. This condition presents safety issues increasing the risk of falls.  There is no current active infection.  There are no active problems to display for this patient.  Past Medical History  Diagnosis Date  . UTI (lower urinary tract infection)   . Cancer     uterine  . Arthritis     Past Surgical History  Procedure Laterality Date  . Abdominal hysterectomy    . Parathyroidectomy  14  . Hip fracture surgery Right 13    split-scews  . Breast surgery Right     bx  . Hydraditaform       (Not in a hospital admission) Allergies  Allergen Reactions  . Bactrim [Sulfamethoxazole-Tmp Ds] Itching  . Citalopram Itching  . Sulfa Antibiotics   . Codeine Nausea Only    History  Substance Use Topics  . Smoking status: Never Smoker   . Smokeless tobacco: Not on file  . Alcohol Use: No    No family history on file.   Review of Systems  Constitutional: Negative.   HENT: Negative.   Eyes: Negative.   Respiratory: Negative.   Cardiovascular: Negative.   Gastrointestinal: Negative.   Genitourinary:  Negative.   Musculoskeletal: Positive for joint pain.  Skin: Negative.   Neurological: Negative.   Endo/Heme/Allergies: Negative.   Psychiatric/Behavioral: Negative.     Objective:  Physical Exam  Constitutional: She is oriented to person, place, and time. She appears well-developed and well-nourished.  HENT:  Head: Normocephalic and atraumatic.  Eyes: EOM are normal. Pupils are equal, round, and reactive to light.  Neck: Neck supple.  Cardiovascular: Normal rate, regular rhythm and normal heart sounds.  Exam reveals no gallop and no friction rub.   No murmur heard. Respiratory: Effort normal and breath sounds normal. No respiratory distress. She has no wheezes. She has no rales.  GI: Soft. Bowel sounds are normal.  Musculoskeletal:  Examination of her left hip reveals an antalgic gait, Trendelenburg component on the left.  Negative straight leg raise.  Positive log roll.  Markedly decreased rotation left hip.  No trochanteric soreness on the left.  She is neurovascularly intact distally.    Neurological: She is alert and oriented to person, place, and time.  Skin: Skin is warm and dry.  Psychiatric: She has a normal mood and affect. Her behavior is normal. Judgment and thought content normal.    Vital signs in last 24 hours: @VSRANGES @  Labs:   Estimated body mass index is 19.87 kg/(m^2) as calculated from the following:   Height as of 06/26/13: 5\' 5"  (1.651 m).   Weight as of 06/26/13: 54.159 kg (119 lb 6.4 oz).  Imaging Review Plain radiographs demonstrate severe degenerative joint disease of the left hip(s). The bone quality appears to be fair for age and reported activity level.  Assessment/Plan:  End stage arthritis, left hip(s)  The patient history, physical examination, clinical judgement of the provider and imaging studies are consistent with end stage degenerative joint disease of the left hip(s) and total hip arthroplasty is deemed medically necessary. The  treatment options including medical management, injection therapy, arthroscopy and arthroplasty were discussed at length. The risks and benefits of total hip arthroplasty were presented and reviewed. The risks due to aseptic loosening, infection, stiffness, dislocation/subluxation,  thromboembolic complications and other imponderables were discussed.  The patient acknowledged the explanation, agreed to proceed with the plan and consent was signed. Patient is being admitted for inpatient treatment for surgery, pain control, PT, OT, prophylactic antibiotics, VTE prophylaxis, progressive ambulation and ADL's and discharge planning.The patient is planning to be discharged home with home health services

## 2013-07-25 NOTE — Anesthesia Postprocedure Evaluation (Signed)
  Anesthesia Post-op Note  Patient: Hannah Schwartz  Procedure(s) Performed: Procedure(s): TOTAL HIP ARTHROPLASTY (Left)  Patient Location: PACU  Anesthesia Type:General  Level of Consciousness: awake, alert , oriented and patient cooperative  Airway and Oxygen Therapy: Patient Spontanous Breathing and Patient connected to nasal cannula oxygen  Post-op Pain: none  Post-op Assessment: Post-op Vital signs reviewed, Patient's Cardiovascular Status Stable, Respiratory Function Stable, Patent Airway, No signs of Nausea or vomiting and Pain level controlled  Post-op Vital Signs: Reviewed and stable  Complications: No apparent anesthesia complications

## 2013-07-25 NOTE — Discharge Summary (Signed)
Patient ID: Hannah Schwartz MRN: [id number] DOB/AGE: 09-18-1930 80 y.o.  Admit date: 07/25/2013 Discharge date: 07/27/2013  Admission Diagnoses:  Active Problems:   Primary localized osteoarthrosis, pelvic region and thigh   Discharge Diagnoses:  Same  Past Medical History  Diagnosis Date  . UTI (lower urinary tract infection)   . Cancer     uterine  . Arthritis     Surgeries: Procedure(s): TOTAL HIP ARTHROPLASTY on 07/25/2013   Consultants:    Discharged Condition: Improved  Hospital Course: [place] OBORN is an 80 y.o. female who was admitted 07/25/2013 for operative treatment of<principal problem not specified>. Patient has severe unremitting pain that affects sleep, daily activities, and work/hobbies. After pre-op clearance the patient was taken to the operating room on 07/25/2013 and underwent  Procedure(s): TOTAL HIP ARTHROPLASTY.    Patient was given perioperative antibiotics:     Anti-infectives   Start     Dose/Rate Route Frequency Ordered Stop   07/25/13 2200  ceFAZolin (ANCEF) IVPB 1 g/50 mL premix     1 g 100 mL/hr over 30 Minutes Intravenous Every 6 hours 07/25/13 2055 07/26/13 0510   07/25/13 0600  ceFAZolin (ANCEF) IVPB 2 g/50 mL premix  Status:  Discontinued     2 g 100 mL/hr over 30 Minutes Intravenous On call to O.R. 07/24/13 1453 07/25/13 1111   07/25/13 0600  ceFAZolin (ANCEF) IVPB 2 g/50 mL premix     2 g 100 mL/hr over 30 Minutes Intravenous On call to O.R. 07/24/13 1453 07/25/13 1340       Patient was given sequential compression devices, early ambulation, and chemoprophylaxis to prevent DVT.  Patient benefited maximally from hospital stay and there were no complications.    Recent vital signs:  Patient Vitals for the past 24 hrs:  BP Temp Temp src Pulse Resp SpO2  07/27/13 0532 116/49 mmHg 97.8 F (36.6 C) - 96 18 97 %  07/27/13 0400 - - - - 20 96 %  07/27/13 0000 - - - - 20 -  07/26/13 2101 116/50 mmHg 98.5 F (36.9 C) Oral 92 20 97 %  07/26/13  2000 - - - - 18 -  07/26/13 1359 110/41 mmHg 98.3 F (36.8 C) - 90 18 100 %     Recent laboratory studies:   Recent Labs  07/26/13 0530 07/27/13 0318  WBC 6.6 11.7*  HGB 11.2* 10.0*  HCT 33.9* 30.0*  PLT 225 224  NA 140 141  K 5.0 5.3  CL 101 102  CO2 29 27  BUN 11 12  CREATININE 0.66 0.65  GLUCOSE 156* 146*  CALCIUM 8.9 8.9     Discharge Medications:     Medication List    STOP taking these medications       acetaminophen 650 MG CR tablet  Commonly known as:  TYLENOL     ALEVE 220 MG tablet  Generic drug:  naproxen sodium     Fish Oil 1200 MG Caps      TAKE these medications       aspirin EC 325 MG tablet  Take 1 tablet (325 mg total) by mouth daily.     b complex vitamins tablet  Take 1 tablet by mouth daily.     calcium-vitamin D 500-200 MG-UNIT per tablet  Commonly known as:  OSCAL WITH D  Take 1 tablet by mouth daily with breakfast.     Ginkgo Biloba 60 MG Caps  Take 60 mg by mouth daily.  Glucosamine Sulfate 1000 MG Caps  Take 1,000 mg by mouth daily.     HYDROcodone-acetaminophen 5-325 MG per tablet  Commonly known as:  NORCO  Take 1-2 tablets by mouth every 4 (four) hours as needed for moderate pain.     methocarbamol 500 MG tablet  Commonly known as:  ROBAXIN  Take 1 tablet (500 mg total) by mouth 4 (four) times daily.     multivitamin with minerals Tabs tablet  Take 1 tablet by mouth daily. Centrum Silver     multivitamin-lutein Caps capsule  Take 1 capsule by mouth daily.     HAIR/SKIN/NAILS PO  Take 10,000 mcg by mouth daily.     ondansetron 4 MG tablet  Commonly known as:  ZOFRAN  Take 1 tablet (4 mg total) by mouth every 8 (eight) hours as needed for nausea or vomiting.     senna 8.6 MG Tabs tablet  Commonly known as:  SENOKOT  Take 2 tablets by mouth at bedtime as needed for mild constipation.     triamcinolone cream 0.1 %  Commonly known as:  KENALOG  Apply 1 application topically at bedtime as needed (itching).      vitamin E 400 UNIT capsule  Take 400 Units by mouth daily.        Diagnostic Studies: Dg Chest 2 View  06/26/2013   CLINICAL DATA:  Preop for total hip arthroplasty.  EXAM: CHEST  2 VIEW  COMPARISON:  None.  FINDINGS: Lungs are adequately inflated without focal consolidation or effusion. There is mild cardiomegaly. There is minimal spondylosis of the spine.  IMPRESSION: No active cardiopulmonary disease.   Electronically Signed   By: Marin Olp M.D.   On: 06/26/2013 17:14    Disposition: Final discharge disposition not confirmed  Discharge Orders   Future Orders Complete By Expires   Call MD / Call 911  As directed    Comments:     If you experience chest pain or shortness of breath, CALL 911 and be transported to the hospital emergency room.  If you develope a fever above 101 F, pus (white drainage) or increased drainage or redness at the wound, or calf pain, call your surgeon's office.   Change dressing  As directed    Comments:     Change the dressing daily with sterile 4 x 4 inch gauze dressing and paper tape.  You may clean the incision with alcohol prior to redressing   Constipation Prevention  As directed    Comments:     Drink plenty of fluids.  Prune juice may be helpful.  You may use a stool softener, such as Colace (over the counter) 100 mg twice a day.  Use MiraLax (over the counter) for constipation as needed.   Diet - low sodium heart healthy  As directed    Discharge instructions  As directed    Comments:     Total Hip Replacement Care After Refer to this sheet in the next few weeks. These discharge instructions provide you with general information on caring for yourself after you leave the hospital. Your caregiver may also give you specific instructions. Your treatment has been planned according to the most current medical practices available, but unavoidable complications sometimes occur. If you have any problems or questions after discharge, please call your  caregiver.  HOME CARE INSTRUCTIONS  You may resume a normal diet and activities as directed.  Perform exercises as directed.  Posterior total hip precautions with weight bearing as tolerated  walking You will receive physical therapy daily  Take showers instead of baths until informed otherwise.    Please wash whole leg including wound with soap and water  Change bandages (dressings)daily It is OK to take over-the-counter tylenol in addition to the tramadol for pain, discomfort, or fever. Eat a well-balanced diet.  Avoid lifting or driving until you are instructed otherwise.  Make an appointment to see your caregiver for stitches (suture) or staple removal as directed.   SEEK MEDICAL CARE IF: You have swelling of your calf or leg.  You develop shortness of breath or chest pain.  You have redness, swelling, or increasing pain in the wound.  There is pus or any unusual drainage coming from the surgical site.  You notice a bad smell coming from the surgical site or dressing.  The surgical site breaks open after sutures or staples have been removed.  There is persistent bleeding from the suture or staple line.  You are getting worse or are not improving.  You have any other questions or concerns.  SEEK IMMEDIATE MEDICAL CARE IF:  You have a fever.  You develop a rash.  You have difficulty breathing.  You develop any reaction or side effects to medicines given.  Your knee motion is decreasing rather than improving.  MAKE SURE YOU:  Understand these instructions.  Will watch your condition.  Will get help right away if you are not doing well or get worse.   Follow the hip precautions as taught in Physical Therapy  As directed    Comments:     Posterior total hip precautions.  Weight bearing as tolerated   Increase activity slowly as tolerated  As directed    TED hose  As directed    Comments:     Use stockings (TED hose) for 3-4 weeks on both leg(s).  You may remove them at night for  sleeping.      Follow-up Information   Follow up with [facility], MD. Schedule an appointment as soon as possible for a visit in 2 weeks.   Specialty:  Orthopedic Surgery   Contact information:   [address] [place] [address] [phone]        Signed: Larae Grooms 07/27/2013, 8:25 AM

## 2013-07-25 NOTE — Anesthesia Preprocedure Evaluation (Addendum)
Anesthesia Evaluation  Patient identified by MRN, date of birth, ID band Patient awake    Reviewed: Allergy & Precautions, H&P , NPO status , Patient's Chart, lab work & pertinent test results  Airway Mallampati: I TM Distance: >3 FB Neck ROM: Full    Dental  (+) Teeth Intact and Dental Advisory Given   Pulmonary          Cardiovascular  '13 ECHO: normal LVF, aortic sclerosis without stenosis '12 Stress test: Negative   Neuro/Psych    GI/Hepatic   Endo/Other    Renal/GU      Musculoskeletal   Abdominal   Peds  Hematology   Anesthesia Other Findings   Reproductive/Obstetrics                          Anesthesia Physical Anesthesia Plan  ASA: II  Anesthesia Plan: General   Post-op Pain Management:    Induction: Intravenous  Airway Management Planned: Oral ETT  Additional Equipment:   Intra-op Plan:   Post-operative Plan: Extubation in OR  Informed Consent:   Plan Discussed with:   Anesthesia Plan Comments:         Anesthesia Quick Evaluation

## 2013-07-25 NOTE — Anesthesia Procedure Notes (Signed)
Procedure Name: Intubation Date/Time: 07/25/2013 1:37 PM Performed by: Sampson Si E Pre-anesthesia Checklist: Patient identified, Emergency Drugs available, Suction available, Patient being monitored and Timeout performed Patient Re-evaluated:Patient Re-evaluated prior to inductionOxygen Delivery Method: Circle system utilized Preoxygenation: Pre-oxygenation with 100% oxygen Intubation Type: IV induction Ventilation: Mask ventilation without difficulty Laryngoscope Size: Mac and 3 Grade View: Grade I Tube type: Oral Tube size: 7.0 mm Number of attempts: 1 Airway Equipment and Method: Stylet Placement Confirmation: ETT inserted through vocal cords under direct vision,  positive ETCO2 and breath sounds checked- equal and bilateral Secured at: 21 cm Tube secured with: Tape Dental Injury: Teeth and Oropharynx as per pre-operative assessment

## 2013-07-26 LAB — CBC
HEMATOCRIT: 33.9 % — AB (ref 36.0–46.0)
Hemoglobin: 11.2 g/dL — ABNORMAL LOW (ref 12.0–15.0)
MCH: 31.1 pg (ref 26.0–34.0)
MCHC: 33 g/dL (ref 30.0–36.0)
MCV: 94.2 fL (ref 78.0–100.0)
Platelets: 225 10*3/uL (ref 150–400)
RBC: 3.6 MIL/uL — AB (ref 3.87–5.11)
RDW: 13 % (ref 11.5–15.5)
WBC: 6.6 10*3/uL (ref 4.0–10.5)

## 2013-07-26 LAB — BASIC METABOLIC PANEL
BUN: 11 mg/dL (ref 6–23)
CALCIUM: 8.9 mg/dL (ref 8.4–10.5)
CO2: 29 meq/L (ref 19–32)
CREATININE: 0.66 mg/dL (ref 0.50–1.10)
Chloride: 101 mEq/L (ref 96–112)
GFR calc Af Amer: >90 mL/min (ref >90)
GFR calc non Af Amer: 81 mL/min — ABNORMAL LOW (ref >90)
GLUCOSE: 156 mg/dL — AB (ref 70–99)
Potassium: 5 mEq/L (ref 3.7–5.3)
Sodium: 140 mEq/L (ref 137–147)

## 2013-07-26 NOTE — Progress Notes (Signed)
Subjective: 1 Day Post-Op Procedure(s) (LRB): TOTAL HIP ARTHROPLASTY (Left) Patient reports pain as 1 on 0-10 scale.  Patient is doing extremely well and is progressing nicely with PT.  No nausea or vomiting.  Positive flatus but no bm as of yet.  Good appetite.  Objective: Vital signs in last 24 hours: Temp:  [97.6 F (36.4 C)-98.6 F (37 C)] 98.6 F (37 C) (02/05 0451) Pulse Rate:  [89-114] 90 (02/05 0451) Resp:  [9-28] 16 (02/05 0451) BP: (85-141)/(42-68) 117/48 mmHg (02/05 0451) SpO2:  [97 %-100 %] 98 % (02/05 0451) Weight:  [53.524 kg (118 lb)] 53.524 kg (118 lb) (02/04 2030)  Intake/Output from previous day: 02/04 0701 - 02/05 0700 In: 3280 [P.O.:480; I.V.:2800] Out: 2265 [Urine:2015; Blood:250] Intake/Output this shift:     Recent Labs  07/26/13 0530  HGB 11.2*    Recent Labs  07/26/13 0530  WBC 6.6  RBC 3.60*  HCT 33.9*  PLT 225    Recent Labs  07/26/13 0530  NA 140  K 5.0  CL 101  CO2 29  BUN 11  CREATININE 0.66  GLUCOSE 156*  CALCIUM 8.9   No results found for this basename: LABPT, INR,  in the last 72 hours  Neurologically intact ABD soft Neurovascular intact Sensation intact distally Intact pulses distally Dorsiflexion/Plantar flexion intact Compartment soft Scant drainage through dressing  Assessment/Plan: 1 Day Post-Op Procedure(s) (LRB): TOTAL HIP ARTHROPLASTY (Left) Advance diet Up with therapy D/C IV fluids Plan for discharge tomorrow Discharge home with home health  [place], M. Mendel Ryder 07/26/2013, 11:36 AM

## 2013-07-26 NOTE — Evaluation (Signed)
Occupational Therapy Evaluation Patient Details Name: Hannah Schwartz MRN: [id number] DOB: 08/12/1930 Today's Date: 07/26/2013 Time: 1134-1158 OT Time Calculation (min): 24 min  OT Assessment / Plan / Recommendation History of present illness 80 yo F s/p L Posterior THA   Clinical Impression   Pt presents to OT with decreased I with ADL activity and will benefit from skilled OT to increase I with ADL activity and return to PLOF       Follow Up Recommendations  Home health OT       Equipment Recommendations  None recommended by OT          Precautions / Restrictions Precautions Precautions: Posterior Hip;Fall Precaution Booklet Issued: Yes (comment) Precaution Comments: hung on bathroom door Restrictions Weight Bearing Restrictions: Yes LLE Weight Bearing: Weight bearing as tolerated       ADL  Grooming: Set up Where Assessed - Grooming: Unsupported sitting Upper Body Bathing: Set up Where Assessed - Upper Body Bathing: Unsupported sitting Lower Body Bathing: Moderate assistance Where Assessed - Lower Body Bathing: Supported sit to stand Upper Body Dressing: Set up Where Assessed - Upper Body Dressing: Unsupported sit to stand Lower Body Dressing: Moderate assistance Where Assessed - Lower Body Dressing: Supported sit to stand Toileting - Clothing Manipulation and Hygiene: Maximal assistance Where Assessed - Toileting Clothing Manipulation and Hygiene: Standing ADL Comments: Educated in use of AE. Daugther already obtained      OT Goals(Current goals can be found in the care plan section) Acute Rehab OT Goals Patient Stated Goal: to be able to walk to the bathroom by herself OT Goal Formulation: With patient Time For Goal Achievement: 08/09/13  Visit Information  Last OT Received On: 07/26/13 Assistance Needed: +1 History of Present Illness: 80 yo F s/p L Posterior THA       Prior Functioning     Home Living Family/patient expects to be discharged to::  Other (Comment) (Daughter's apartment) Living Arrangements: Children Available Help at Discharge: Available 24 hours/day;Family Type of Home: Apartment Home Access: Stairs to enter Entrance Stairs-Number of Steps: 1 Entrance Stairs-Rails: None Home Layout: One level Home Equipment: Walker - 2 wheels;Bedside commode;Other (comment) (Daughter plans to purchase hip kit today) Prior Function Level of Independence: Independent Communication Communication: No difficulties         Vision/Perception Vision - History Patient Visual Report: No change from baseline   Cognition  Cognition Arousal/Alertness: Awake/alert Behavior During Therapy: WFL for tasks assessed/performed (pt used humor throughout session) Overall Cognitive Status: Within Functional Limits for tasks assessed Memory: Decreased recall of precautions    Extremity/Trunk Assessment Upper Extremity Assessment Upper Extremity Assessment: Generalized weakness Lower Extremity Assessment Lower Extremity Assessment: LLE deficits/detail LLE Deficits / Details: general weakness secondary to L THA. Quads at least 3/5.      Mobility Bed Mobility Overal bed mobility:  (NT due to up in chair already) Transfers Overall transfer level: Needs assistance Equipment used: Ambulation equipment used Transfers: Sit to/from Stand;Stand Pivot Transfers Sit to Stand: Min assist Stand pivot transfers: Min assist General transfer comment: cues for safe techniques. Pt transfered to BSC to urinate     Exercise Total Joint Exercises Ankle Circles/Pumps: AROM;Both;10 reps;Seated Hip ABduction/ADduction: AAROM;Left;10 reps;Supine Long Arc Quad: AROM;Left;10 reps;Seated   Balance Balance Overall balance assessment: Needs assistance Standing balance support: Bilateral upper extremity supported Standing balance-Leahy Scale: Fair (with RW) General Comments General comments (skin integrity, edema, etc.): Daughter present for entire session.      End of Session OT -   End of Session Equipment Utilized During Treatment: Rolling walker Activity Tolerance: Patient tolerated treatment well Patient left: in chair Nurse Communication: Mobility status  GO     ,  D 07/26/2013, 12:04 PM  

## 2013-07-26 NOTE — Progress Notes (Signed)
Physical Therapy Treatment Patient Details Name: Hannah Schwartz MRN: [id number] DOB: 03/13/1931 Today's Date: 07/26/2013 Time: 5170-0174 PT Time Calculation (min): 27 min  PT Assessment / Plan / Recommendation  History of Present Illness 80 yo F s/p L Posterior THA   PT Comments   Pt with more pain this pm therefore deferred exercises.  Mobilizing well.  Pt is anxious about possible DC home tomorrow. Encouraged pt that she is doing really well with mobility.    Follow Up Recommendations  Home health PT;Supervision for mobility/OOB     Does the patient have the potential to tolerate intense rehabilitation     Barriers to Discharge        Equipment Recommendations  None recommended by PT    Recommendations for Other Services OT consult  Frequency 7X/week   Progress towards PT Goals Progress towards PT goals: Progressing toward goals  Plan      Precautions / Restrictions Precautions Precautions: Posterior Hip;Fall Precaution Booklet Issued: Yes (comment) Precaution Comments: hung on bathroom door Restrictions Weight Bearing Restrictions: Yes LLE Weight Bearing: Weight bearing as tolerated   Pertinent Vitals/Pain 9/10 pain in L hip. RN made aware and brought a second percocet.  Positioned comfortably in bed.     Mobility  Bed Mobility Overal bed mobility: Needs Assistance  Bed Mobility: Sit to Supine Sit to supine: Mod assist General bed mobility comments: verbal and tactile cues for technique Transfers Overall transfer level: Needs assistance Equipment used: Ambulation equipment used Sit to Stand: Min assist General transfer comment: cues for safe techniques.  Ambulation/Gait Ambulation/Gait assistance: Min assist Ambulation Distance (Feet): 30 Feet Assistive device: Rolling walker (2 wheeled) Gait Pattern/deviations: Step-to pattern Gait velocity: decreased General Gait Details: cues for sequencing    Exercises     PT Diagnosis:    PT Problem List:   PT  Treatment Interventions:     PT Goals (current goals can now be found in the care plan section) Acute Rehab PT Goals Patient Stated Goal: to be able to walk to the bathroom by herself PT Goal Formulation: With patient Time For Goal Achievement: 08/02/13 Potential to Achieve Goals: Good  Visit Information  Last PT Received On: 07/26/13 Assistance Needed: +1 History of Present Illness: 80 yo F s/p L Posterior THA    Subjective Data  Patient Stated Goal: to be able to walk to the bathroom by herself   Cognition  Cognition Arousal/Alertness: Awake/alert Behavior During Therapy: WFL for tasks assessed/performed (pt used humor throughout session) Overall Cognitive Status: Within Functional Limits for tasks assessed Memory: Decreased recall of precautions    Balance  Balance Standing balance-Leahy Scale: Fair (with RW) General Comments General comments (skin integrity, edema, etc.): Daughter and Daughter in law present for entire session  End of Session PT - End of Session Equipment Utilized During Treatment: Gait belt Activity Tolerance: Patient limited by pain Patient left: with call bell/phone within reach;with family/visitor present;in bed Nurse Communication: Mobility status;Patient requests pain meds   GP     Melvern Banker 07/26/2013, 3:26 PM Lavonia Dana, [place] 07/26/2013

## 2013-07-26 NOTE — Evaluation (Signed)
Physical Therapy Evaluation Patient Details Name: Hannah Schwartz MRN: [id number] DOB: [birth year] Today's Date: 07/26/2013 Time: 2536-6440 PT Time Calculation (min): 38 min  PT Assessment / Plan / Recommendation History of Present Illness  80 yo F s/p L Posterior THA  Clinical Impression  Pt is s/p THA resulting in the deficits listed below (see PT Problem List).  Pt will benefit from skilled PT to increase their independence and safety with mobility to allow discharge to her daughter's home where she will have full time care at first upon DC.     PT Assessment  Patient needs continued PT services    Follow Up Recommendations  Home health PT;Supervision for mobility/OOB    Does the patient have the potential to tolerate intense rehabilitation      Barriers to Discharge        Equipment Recommendations  None recommended by PT    Recommendations for Other Services OT consult   Frequency 7X/week    Precautions / Restrictions Precautions Precautions: Posterior Hip;Fall Precaution Booklet Issued: Yes (comment) Precaution Comments: hung on bathroom door Restrictions Weight Bearing Restrictions: Yes LLE Weight Bearing: Weight bearing as tolerated   Pertinent Vitals/Pain 0/10 pain with exercises      Mobility  Bed Mobility Overal bed mobility:  (NT due to up in chair already) Transfers Overall transfer level: Needs assistance Equipment used: Ambulation equipment used Transfers: Sit to/from [facility] Sit to Stand: Min assist Stand pivot transfers: Min assist General transfer comment: cues for safe techniques. Pt transfered to [facility] to urinate Ambulation/Gait Ambulation/Gait assistance: Min assist;+2 safety/equipment Ambulation Distance (Feet): 20 Feet Assistive device: Rolling walker (2 wheeled) Gait Pattern/deviations: Step-to pattern;Antalgic Gait velocity: decreased General Gait Details: cues for sequencing    Exercises Total Joint Exercises Ankle  Circles/Pumps: AROM;Both;10 reps;Seated Hip ABduction/ADduction: AAROM;Left;10 reps;Supine Long Arc Quad: AROM;Left;10 reps;Seated   PT Diagnosis: Difficulty walking  PT Problem List: Decreased strength;Decreased activity tolerance;Decreased balance;Decreased mobility;Decreased knowledge of use of DME;Decreased knowledge of precautions PT Treatment Interventions: DME instruction;Gait training;Stair training;Therapeutic exercise;Patient/family education     PT Goals(Current goals can be found in the care plan section) Acute Rehab PT Goals Patient Stated Goal: to be able to walk to the bathroom by herself PT Goal Formulation: With patient Time For Goal Achievement: 08/02/13 Potential to Achieve Goals: Good  Visit Information  Last PT Received On: 07/26/13 Assistance Needed: +1 History of Present Illness: 80 yo F s/p L Posterior THA       Prior [place] expects to be discharged to:: Other (Comment) (Daughter's apartment) Living Arrangements: Children Available Help at Discharge: Available 24 hours/day;Family Type of Home: Apartment Home Access: Stairs to enter [employer] of Steps: 1 Entrance Stairs-Rails: None Home Layout: One level Home Equipment: Chicken - 2 wheels;Bedside commode;Other (comment) (Daughter plans to purchase hip kit today) Prior Function Level of Independence: Independent Communication Communication: No difficulties    Cognition  Cognition Arousal/Alertness: Awake/alert Behavior During Therapy: WFL for tasks assessed/performed (pt used humor throughout session) Overall Cognitive Status: Within Functional Limits for tasks assessed Memory: Decreased recall of precautions    Extremity/Trunk Assessment Upper Extremity Assessment Upper Extremity Assessment: Defer to OT evaluation Lower Extremity Assessment Lower Extremity Assessment: LLE deficits/detail LLE Deficits / Details: general weakness secondary to L THA.  Quads at least 3/5.    Balance Balance Overall balance assessment: Needs assistance Standing balance support: Bilateral upper extremity supported Standing balance-Leahy Scale: Fair (with RW) General Comments General comments (skin integrity, edema,  etc.): Daughter present for entire session.    End of Session PT - End of Session Equipment Utilized During Treatment: Gait belt Activity Tolerance: Patient tolerated treatment well Patient left: in chair;with call bell/phone within reach;with family/visitor present Nurse Communication: Mobility status  GP     Melvern Banker 07/26/2013, 10:49 AM Lavonia Dana, PT  [phone] 07/26/2013

## 2013-07-26 NOTE — Progress Notes (Signed)
Utilization review completed.  

## 2013-07-26 NOTE — Op Note (Signed)
NAMETIFANI, DACK NO.:  [id number]  MEDICAL RECORD NO.:  [id number]  LOCATION:  5N32C                        FACILITY:  [facility]  PHYSICIAN:  Ninetta Lights, M.D. DATE OF BIRTH:  [birth year]  DATE OF PROCEDURE:  07/25/2013 DATE OF DISCHARGE:                              OPERATIVE REPORT   PREOPERATIVE DIAGNOSIS:  Left hip end-stage degenerative arthritis.  POSTOPERATIVE DIAGNOSIS:  Left hip end-stage degenerative arthritis.  PROCEDURE:  Posterolateral total hip replacement utilizing a Stryker prosthesis.  A press-fit 50-mm acetabular component.  Screw fixation x2. A 36-mm internal diameter liner with a 10-degree overhang.  A press-fit #8 secure fit femoral component.  A 36 -2.5 femoral head.  SURGEON-Theron Cumbie:  Ninetta Lights, MD  ASSISTANT:  Eula Listen, PA present throughout the entire case, necessary for timely completion of procedure.  ANESTHESIA:  General.  BLOOD LOSS:  200 mL.  BLOOD GIVEN:  None.  SPECIMENS:  None.  CULTURES:  None.  COMPLICATIONS:  None.  DRESSINGS:  Soft compressive abduction pillow.  PROCEDURE:  The patient was brought to the operating room, placed on the operating room table in supine position.  After adequate anesthesia had been obtained, turned to a lateral position,  appropriate padding and support.  Prepped and draped in usual sterile fashion.  Incision along the shaft of femur extending posterosuperior.  Skin and subcutaneous tissue divided.  Hemostasis with cautery.  Iliotibial band exposed and incised.  Charnley retractor was put in place.  Neurovascular structures identified, protected.  External rotator capsule taken down off the back of the intertrochanteric groove off the femur.  Tagged with FiberWire. Hip dislocated posteriorly.  Femoral neck cut 1 fingerbreadth above the lesser trochanter.  Acetabulum exposed.  Redundant torn labrum debrided. Acetabulum brought up to good bleeding bone, sufficient  medial inferior placement.  Fitted with a 50-mm cup at 45 degrees of abduction, 20 degrees of anteversion.  Good capturing and fixation augmented with 2 screws through the cup.  A 36-mm internal diameter liner with a 10- degree overhanging posterosuperior.  Femur exposed.  Reamers and broaches bring it up to good filling proximally and distal restoring normal anteversion.  After appropriate trials, a #8 stem was seated. The 36 -2.5 head attached.  Hip reduced.  Equal leg lengths, great stability, full motion.  Wound irrigated.  Soft tissues injected with Exparel.  Neurovascular structures identified, protected throughout. Iliotibial band closed with #1 Vicryl subcutaneous and subcuticular closure.  Margins were injected with Marcaine.  Sterile compressive dressing applied.  Returned to supine position.  Abduction pillow placed.  Anesthesia reversed. Brought to the recovery room.  Tolerated the surgery well.  No complications.     Ninetta Lights, M.D.     DFM/MEDQ  D:  07/25/2013  T:  07/26/2013  Job:  [id number]

## 2013-07-27 ENCOUNTER — Encounter (HOSPITAL_COMMUNITY): Payer: Self-pay | Admitting: Orthopedic Surgery

## 2013-07-27 LAB — BASIC METABOLIC PANEL
BUN: 12 mg/dL (ref 6–23)
CALCIUM: 8.9 mg/dL (ref 8.4–10.5)
CO2: 27 mEq/L (ref 19–32)
Chloride: 102 mEq/L (ref 96–112)
Creatinine, Ser: 0.65 mg/dL (ref 0.50–1.10)
GFR, EST NON AFRICAN AMERICAN: 82 mL/min — AB (ref >90)
Glucose, Bld: 146 mg/dL — ABNORMAL HIGH (ref 70–99)
Potassium: 5.3 mEq/L (ref 3.7–5.3)
Sodium: 141 mEq/L (ref 137–147)

## 2013-07-27 LAB — CBC
HCT: 30 % — ABNORMAL LOW (ref 36.0–46.0)
Hemoglobin: 10 g/dL — ABNORMAL LOW (ref 12.0–15.0)
MCH: 31.4 pg (ref 26.0–34.0)
MCHC: 33.3 g/dL (ref 30.0–36.0)
MCV: 94.3 fL (ref 78.0–100.0)
PLATELETS: 224 10*3/uL (ref 150–400)
RBC: 3.18 MIL/uL — ABNORMAL LOW (ref 3.87–5.11)
RDW: 13.1 % (ref 11.5–15.5)
WBC: 11.7 10*3/uL — AB (ref 4.0–10.5)

## 2013-07-27 NOTE — Progress Notes (Signed)
Physical Therapy Treatment Patient Details Name: Hannah Schwartz MRN: [id number] DOB: 10/11/1930 Today's Date: 07/27/2013 Time: 6226-3335 PT Time Calculation (min): 31 min  PT Assessment / Plan / Recommendation  History of Present Illness 80 yo F s/p L Posterior THA   PT Comments   Education completed with daughter. Pt needs repetition in order to recall new learning.  Answered all questions regarding mobility including educating on car transfers.  Pt/daughter feel ready for DC at this time.     Follow Up Recommendations  Home health PT;Supervision for mobility/OOB     Does the patient have the potential to tolerate intense rehabilitation     Barriers to Discharge        Equipment Recommendations  None recommended by PT    Recommendations for Other Services OT consult  Frequency 7X/week   Progress towards PT Goals Progress towards PT goals: Progressing toward goals  Plan Current plan remains appropriate    Precautions / Restrictions Precautions Precautions: Posterior Hip;Fall Precaution Booklet Issued: Yes (comment) Precaution Comments: hung on bathroom door Restrictions Weight Bearing Restrictions: Yes LLE Weight Bearing: Weight bearing as tolerated   Pertinent Vitals/Pain Pt c/o pain with mobility but did not rate. She states she is due for more meds around 1430.       Mobility  Bed Mobility Overal bed mobility: Needs Assistance (NT due to up in chair already) Bed Mobility: Supine to Sit;Sit to Supine Supine to sit: Min assist Sit to supine: Min assist General bed mobility comments: cues for technique. Assist needed for both LE's Transfers Overall transfer level: Needs assistance Equipment used: Ambulation equipment used Transfers: Sit to/from Stand Sit to Stand: Min assist General transfer comment: cues to place LLE out in front with transfers. Explanation to daughter Ambulation/Gait Ambulation/Gait assistance: Min guard Ambulation Distance (Feet): 10  Feet Assistive device: Rolling walker (2 wheeled) Gait Pattern/deviations: Step-to pattern Gait velocity: decreased General Gait Details: cues for sequencing Stairs: Yes Stairs assistance: Min assist Stair Management: With walker Number of Stairs: 1 General stair comments: pt able to verbalize back proper stair technique.     Exercises     PT Diagnosis:    PT Problem List:   PT Treatment Interventions:     PT Goals (current goals can now be found in the care plan section) Acute Rehab PT Goals Patient Stated Goal: to be able to walk to the bathroom by herself PT Goal Formulation: With patient Time For Goal Achievement: 08/02/13 Potential to Achieve Goals: Good  Visit Information  Last PT Received On: 07/27/13 Assistance Needed: +1 History of Present Illness: 80 yo F s/p L Posterior THA    Subjective Data  Patient Stated Goal: to be able to walk to the bathroom by herself   Cognition  Cognition Arousal/Alertness: Awake/alert Behavior During Therapy: WFL for tasks assessed/performed (pt used humor throughout session) Overall Cognitive Status: Within Functional Limits for tasks assessed    Balance  Balance Standing balance-Leahy Scale: Fair (with RW) General Comments General comments (skin integrity, edema, etc.): daughter present for entire session and educated on mobility/safety. She practiced bed mobility, transfers, gait, and stairs with pt. Both feel safe for DC home today.    End of Session PT - End of Session Activity Tolerance: Patient tolerated treatment well Patient left: with call bell/phone within reach;in chair;with family/visitor present Nurse Communication: Other (comment) (Pt ready for DC home from a mobiity standpoint)   GP     Melvern Banker 07/27/2013, 2:18 PM Lavonia Dana,  PT  [phone] 07/27/2013

## 2013-07-27 NOTE — Progress Notes (Signed)
Patient d/c'd to home with daughter in [place]. Discharge instructions given with no voiced concerns. No s/sx of acute distress. Went down in wheelchair. Appreciative of care received.

## 2013-07-27 NOTE — Progress Notes (Signed)
Physical Therapy Treatment Patient Details Name: Hannah Schwartz MRN: [id number] DOB: 1931-02-06 Today's Date: 07/27/2013 Time: 8413-2440 PT Time Calculation (min): 27 min  PT Assessment / Plan / Recommendation  History of Present Illness 80 yo F s/p L Posterior THA   PT Comments   Pt continues to progress well with mobility.  Pt anxious about DC home today. Reassured pt she is doing well and will have good assistance as home.    Follow Up Recommendations  Home health PT;Supervision for mobility/OOB     Does the patient have the potential to tolerate intense rehabilitation     Barriers to Discharge        Equipment Recommendations  None recommended by PT    Recommendations for Other Services OT consult  Frequency 7X/week   Progress towards PT Goals Progress towards PT goals: Progressing toward goals  Plan      Precautions / Restrictions Precautions Precautions: Posterior Hip;Fall Precaution Booklet Issued: Yes (comment) Precaution Comments: hung on bathroom door Restrictions Weight Bearing Restrictions: Yes LLE Weight Bearing: Weight bearing as tolerated   Pertinent Vitals/Pain 2/10 left hip pain with movement.     Mobility  Bed Mobility Overal bed mobility:  (NT due to up in chair already) Transfers Overall transfer level: Needs assistance Equipment used: Ambulation equipment used Transfers: Sit to/from Stand Sit to Stand: Min assist General transfer comment: cues to place LLE out in front with transfers Ambulation/Gait Ambulation/Gait assistance: Min guard Ambulation Distance (Feet): 75 Feet Assistive device: Rolling walker (2 wheeled) Gait Pattern/deviations: Step-to pattern Gait velocity: decreased General Gait Details: cues for sequencing, and not to step too close to front of walker Stairs: Yes Stairs assistance: Min assist Stair Management: Forwards;With walker Number of Stairs: 1 General stair comments: pt able to verbalize back proper stair technique.  Practiced twice    Exercises Total Joint Exercises Ankle Circles/Pumps: AROM;Both;10 reps;Seated Hip ABduction/ADduction: AAROM;Left;10 reps;Supine Long Arc Quad: AROM;Left;10 reps;Seated;Right   PT Diagnosis:    PT Problem List:   PT Treatment Interventions:     PT Goals (current goals can now be found in the care plan section) Acute Rehab PT Goals Patient Stated Goal: to be able to walk to the bathroom by herself PT Goal Formulation: With patient Time For Goal Achievement: 08/02/13 Potential to Achieve Goals: Good  Visit Information  Assistance Needed: +1 History of Present Illness: 80 yo F s/p L Posterior THA    Subjective Data  Patient Stated Goal: to be able to walk to the bathroom by herself   Cognition  Cognition Arousal/Alertness: Awake/alert Behavior During Therapy: WFL for tasks assessed/performed (pt used humor throughout session) Overall Cognitive Status: Within Functional Limits for tasks assessed    Balance  Balance Standing balance-Leahy Scale: Fair (with RW) General Comments General comments (skin integrity, edema, etc.): No family present this session. Will complete caregiver education next session  End of Session PT - End of Session Equipment Utilized During Treatment: Gait belt Activity Tolerance: Patient tolerated treatment well Patient left: with call bell/phone within reach;in chair;Other (comment) (OT present to work with pt)   GP     Melvern Banker 07/27/2013, 9:28 AM Hannah Schwartz, PT  [phone] 07/27/2013

## 2013-07-27 NOTE — Progress Notes (Signed)
Subjective: 2 Days Post-Op Procedure(s) (LRB): TOTAL HIP ARTHROPLASTY (Left) Patient reports pain as 2 on 0-10 scale.  Patient continues to do very well with PT.  No nausea/vomiting.  Positive flatus, but no bm as of yet.  Tolerating diet well.    Objective: Vital signs in last 24 hours: Temp:  [97.8 F (36.6 C)-98.5 F (36.9 C)] 97.8 F (36.6 C) (02/06 0532) Pulse Rate:  [90-96] 96 (02/06 0532) Resp:  [18-20] 18 (02/06 0532) BP: (110-116)/(41-50) 116/49 mmHg (02/06 0532) SpO2:  [96 %-100 %] 97 % (02/06 0532)  Intake/Output from previous day: 02/05 0701 - 02/06 0700 In: 1440 [P.O.:1440] Out: 850 [Urine:850] Intake/Output this shift: Total I/O In: 360 [P.O.:360] Out: -    Recent Labs  07/26/13 0530 07/27/13 0318  HGB 11.2* 10.0*    Recent Labs  07/26/13 0530 07/27/13 0318  WBC 6.6 11.7*  RBC 3.60* 3.18*  HCT 33.9* 30.0*  PLT 225 224    Recent Labs  07/26/13 0530 07/27/13 0318  NA 140 141  K 5.0 5.3  CL 101 102  CO2 29 27  BUN 11 12  CREATININE 0.66 0.65  GLUCOSE 156* 146*  CALCIUM 8.9 8.9   No results found for this basename: LABPT, INR,  in the last 72 hours  Neurologically intact ABD soft Neurovascular intact Sensation intact distally Intact pulses distally Dorsiflexion/Plantar flexion intact Incision: scant drainage No cellulitis present Compartment soft  Assessment/Plan: 2 Days Post-Op Procedure(s) (LRB): TOTAL HIP ARTHROPLASTY (Left) Advance diet Up with therapy Discharge home with home health  [place], M. Mendel Ryder 07/27/2013, 8:23 AM

## 2013-07-27 NOTE — Progress Notes (Signed)
Occupational Therapy Treatment Patient Details Name: Hannah Schwartz MRN: [id number] DOB: [birth year] Today's Date: 07/27/2013 Time: 2751-7001 OT Time Calculation (min): 23 min  OT Assessment / Plan / Recommendation  History of present illness 80 yo F s/p L Posterior THA   OT comments  Pt did well walking to bathroom but did need extra time and encouragement.  Pt able to recall hip precautions in terms of ADL activity.  Daughter took AE home.   Follow Up Recommendations  Home health OT       Equipment Recommendations  None recommended by OT       Frequency Min 2X/week      Plan Discharge plan remains appropriate    Precautions / Restrictions Precautions Precautions: Posterior Hip;Fall Precaution Booklet Issued: Yes (comment) Precaution Comments: hung on bathroom door Restrictions Weight Bearing Restrictions: Yes LLE Weight Bearing: Weight bearing as tolerated       ADL  Grooming: Supervision/safety Where Assessed - Grooming: Unsupported standing Toilet Transfer: Minimal assistance Toilet Transfer Method: Sit to stand;Other (comment) (walk to bathroom with walker) Toilet Transfer Equipment: Comfort height toilet Toileting - Clothing Manipulation and Hygiene: Moderate assistance Where Assessed - Toileting Clothing Manipulation and Hygiene: Standing Transfers/Ambulation Related to ADLs: Reinterated hip precautions while standing at sink for grooming in terms of bending slightly to spit in sink      OT Goals(current goals can now be found in the care plan section) Acute Rehab OT Goals Patient Stated Goal: to be able to walk to the bathroom by herself  Visit Information  Last OT Received On: 07/27/13 Assistance Needed: +1 History of Present Illness: 80 yo F s/p L Posterior THA          Cognition  Cognition Arousal/Alertness: Awake/alert Behavior During Therapy: WFL for tasks assessed/performed (pt used humor throughout session) Overall Cognitive Status: Within  Functional Limits for tasks assessed    Mobility  Bed Mobility Overal bed mobility:  (NT due to up in chair already) Transfers Overall transfer level: Needs assistance Equipment used: Ambulation equipment used;Rolling walker (2 wheeled) Transfers: Sit to/from Stand Sit to Stand: Min assist General transfer comment: cues to place LLE out in front with transfers       Balance Balance Standing balance-Leahy Scale: Fair (with RW) General Comments General comments (skin integrity, edema, etc.): No family present this session. Will complete caregiver education next session  End of Session OT - End of Session Activity Tolerance: Patient tolerated treatment well Patient left: in chair with call bell with in reach  [place], Thereasa Parkin 07/27/2013, 10:02 AM

## 2013-07-27 NOTE — Progress Notes (Signed)
07/27/13 Spoke with patient about [facility], she chose [facility], [phone], spoke with Santiago Glad, set up HHPT and [place], they will be able to start service 07/29/13.  Patient will be with her daughter at her daughter's address -[phone], [address] [place], [place].Faxed facesheet, orders, face to face, H and P, OR note, and Pt/Ot note to [phone], confirmation received. No equipment needs identified. Fuller Plan RN, BSN, CCM

## 2014-12-08 IMAGING — CR DG CHEST 2V
2 series · 2 of 2 positions shown · non-contrast
Comparison: None.

CLINICAL DATA: Preop for total hip arthroplasty.

EXAM:
CHEST  2 VIEW

[w chest pa]
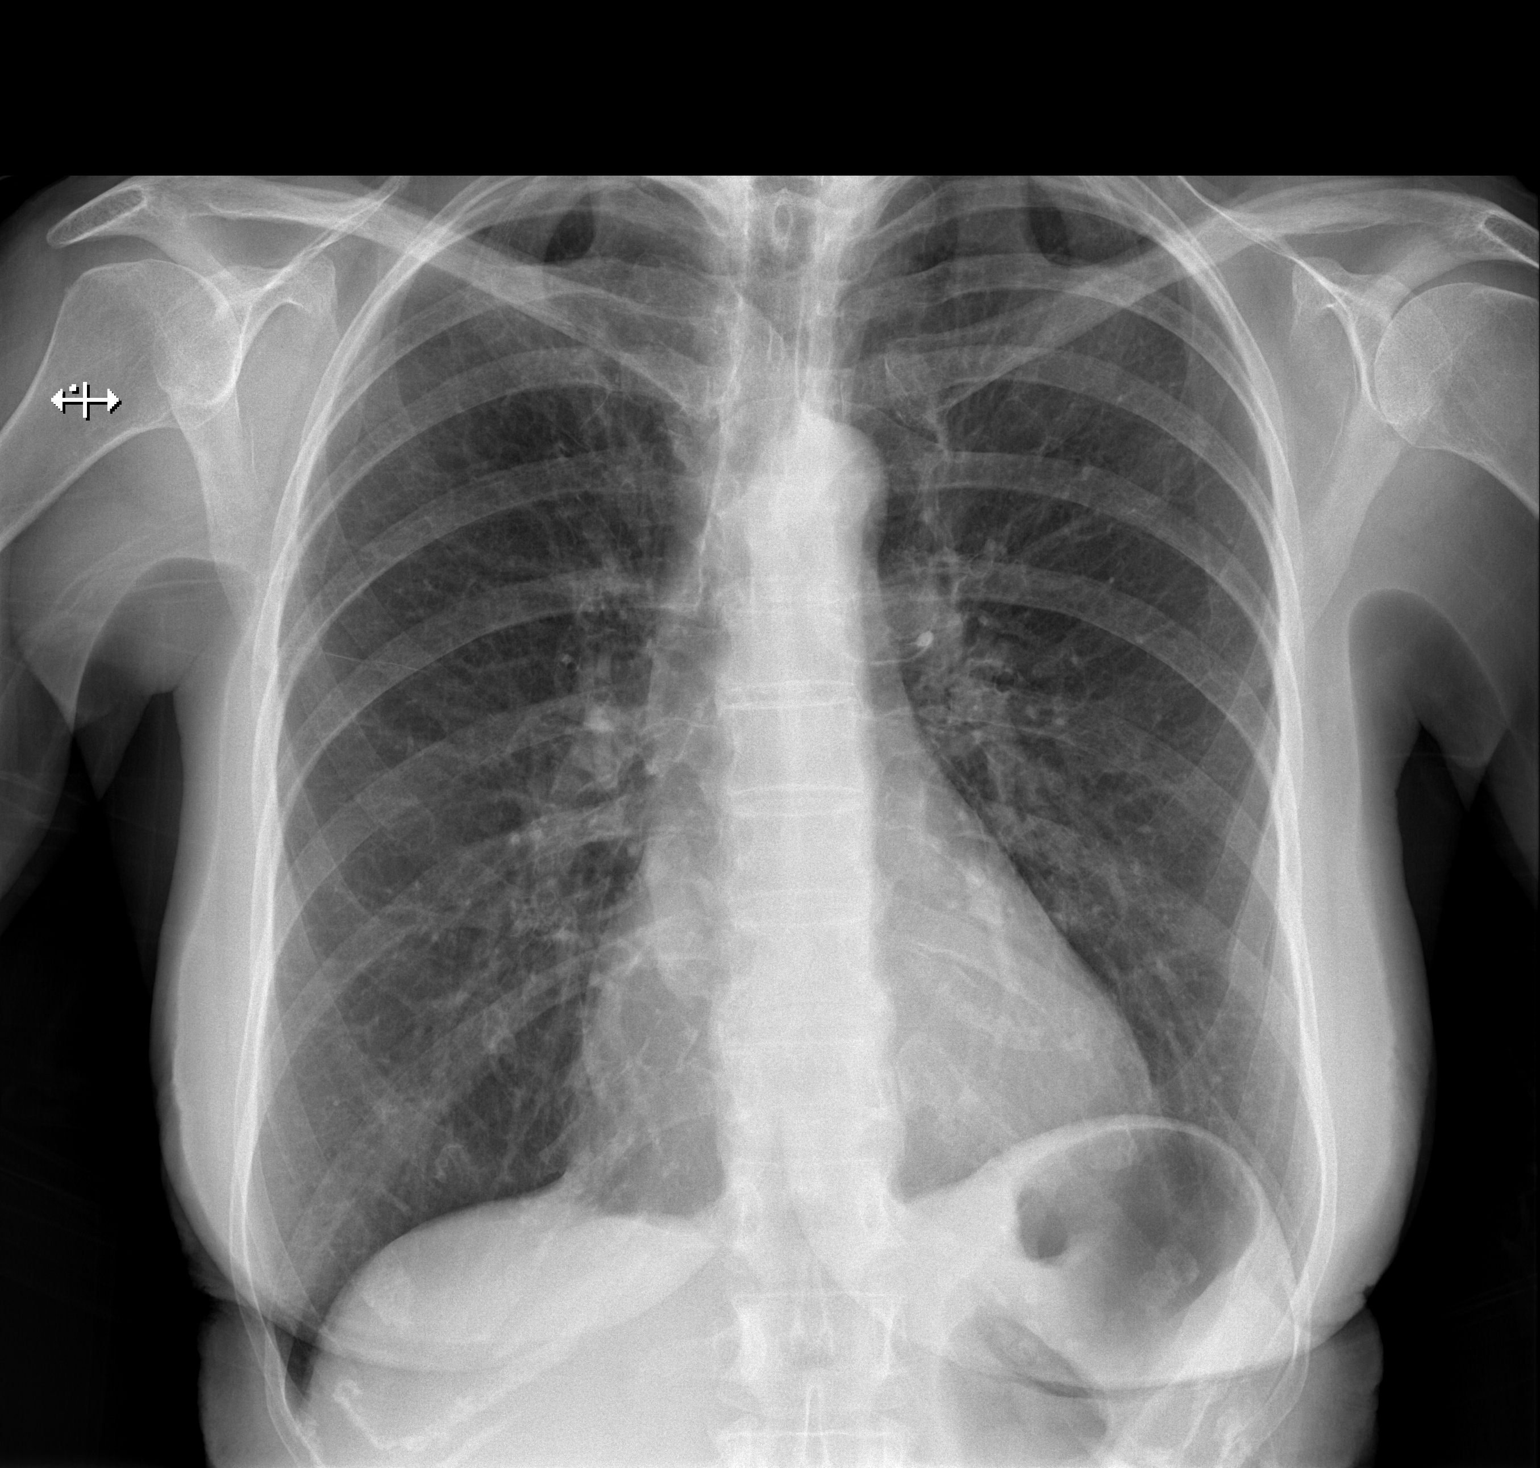

[w chest lat]
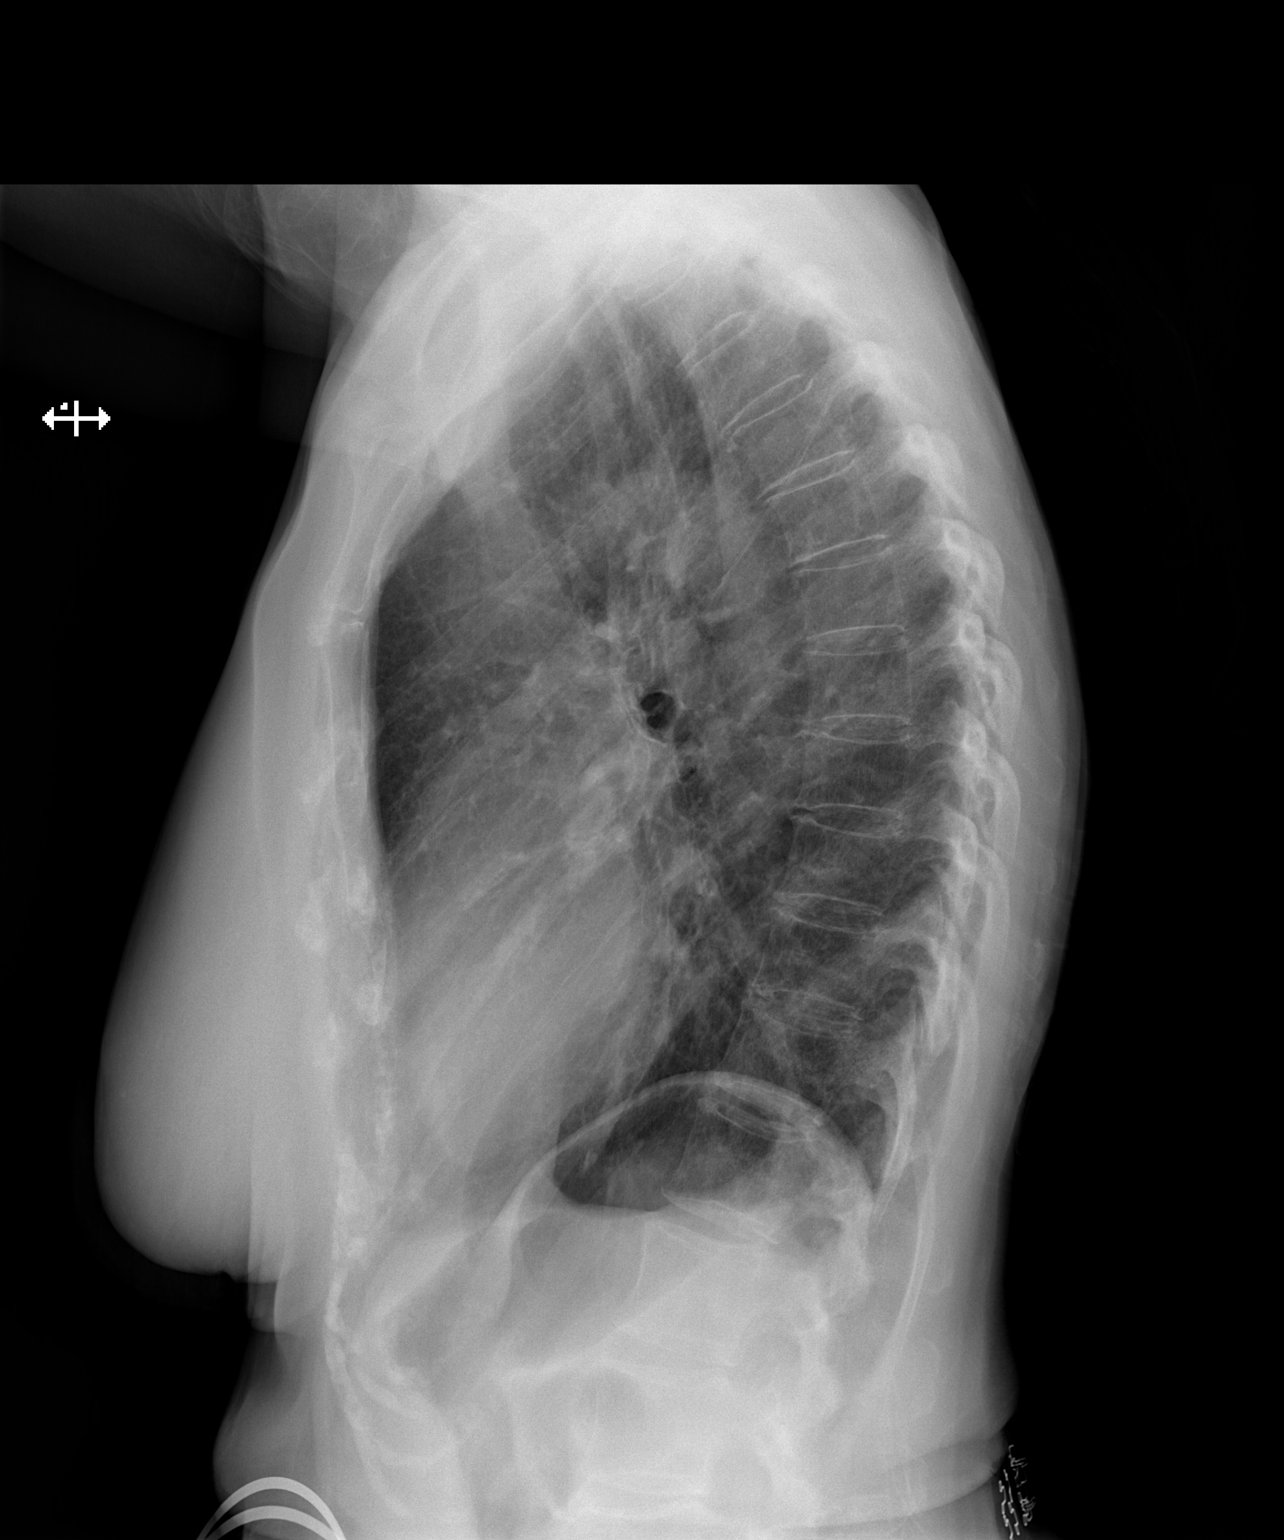

[2 of 2 positions shown; findings below may reference images not displayed]

FINDINGS: Lungs are adequately inflated without focal consolidation or
effusion. There is mild cardiomegaly. There is minimal spondylosis
of the spine.
IMPRESSION: No active cardiopulmonary disease.

## 2015-01-06 IMAGING — DX DG PORTABLE PELVIS
1 series · 1 of 1 positions shown · non-contrast
Comparison: Plain films left hip 01/16/2013.

CLINICAL DATA: Left hip replacement.

EXAM:
PORTABLE PELVIS 1-2 VIEWS

[ap]
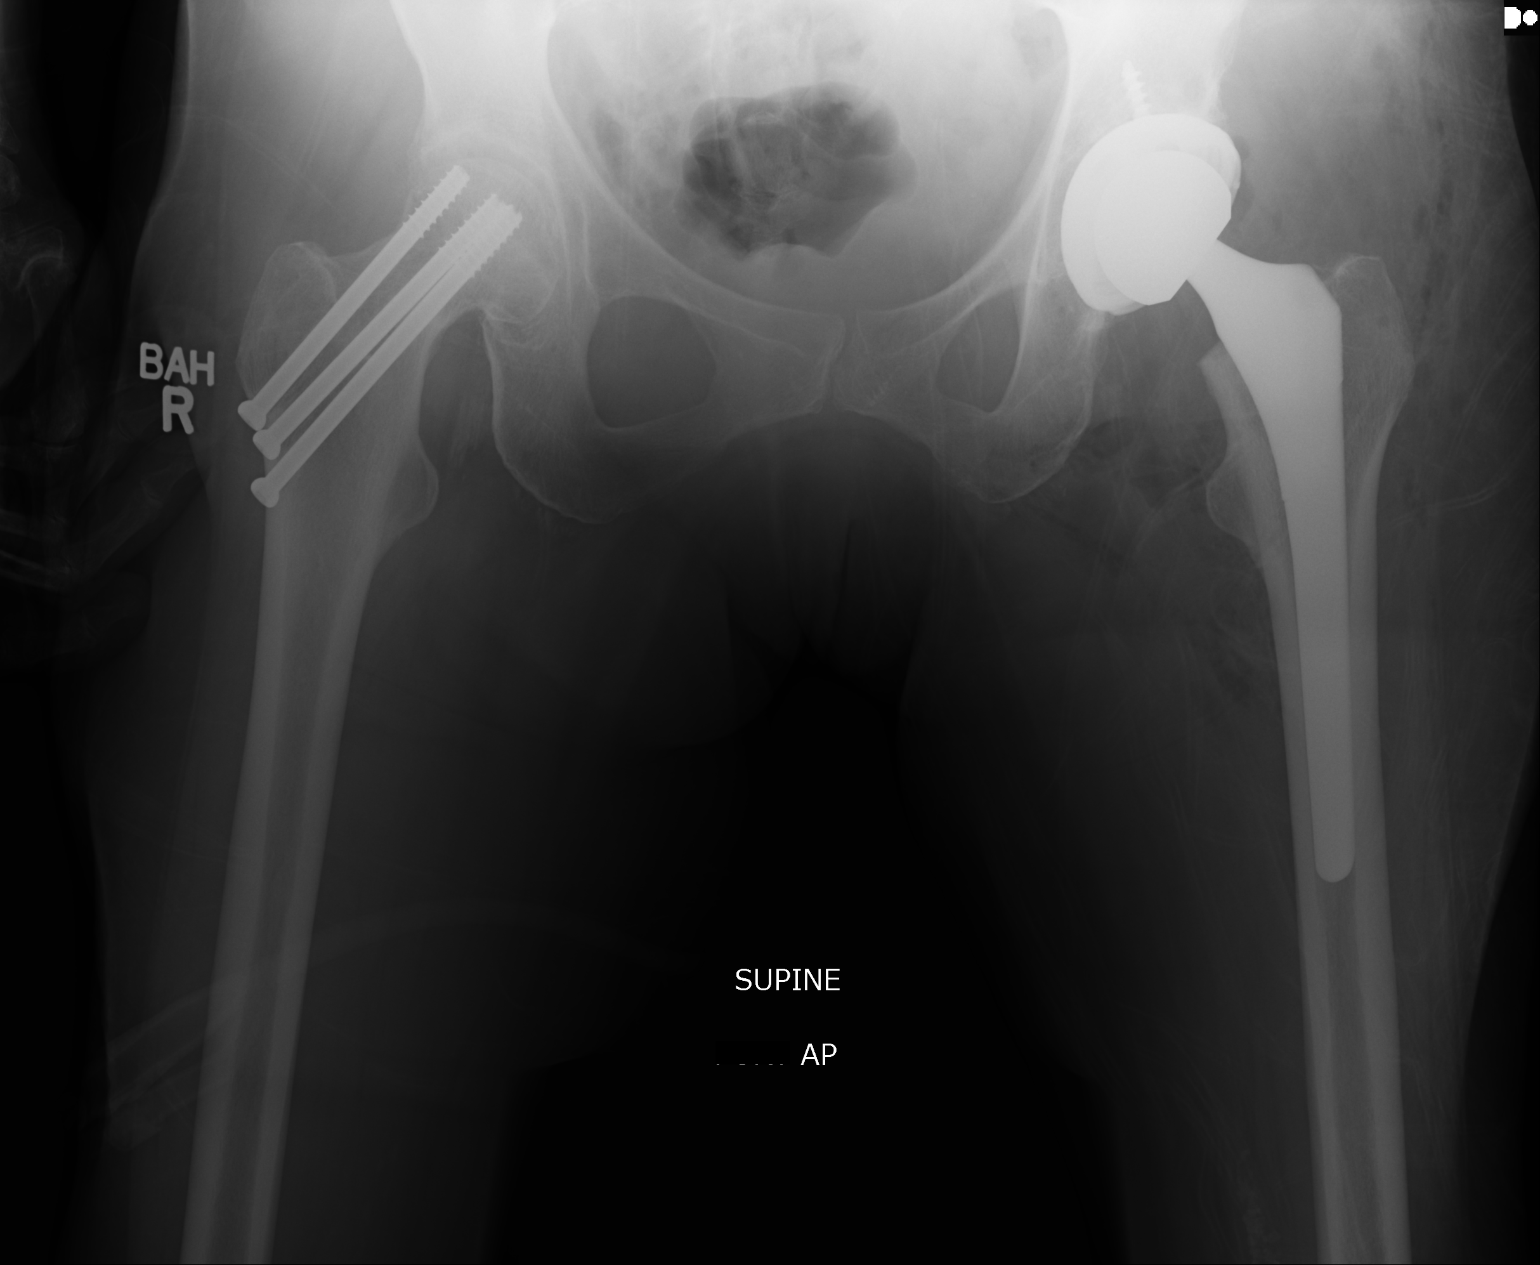

[1 of 1 positions shown; findings below may reference images not displayed]

FINDINGS: The patient has a new left total hip arthroplasty. The device is
located and there is no fracture. Gas in the soft tissues from
surgery noted. Prior fixation of a right hip fracture is again seen.
IMPRESSION: New left total hip replacement.  No acute abnormality.

## 2015-01-06 IMAGING — DX DG HIP 1V PORT*L*
1 series · 1 of 1 positions shown · non-contrast
Comparison: Plain films left hip 01/16/2013.

CLINICAL DATA: Hip replacement.

EXAM:
PORTABLE LEFT HIP - 1 VIEW

[lat]
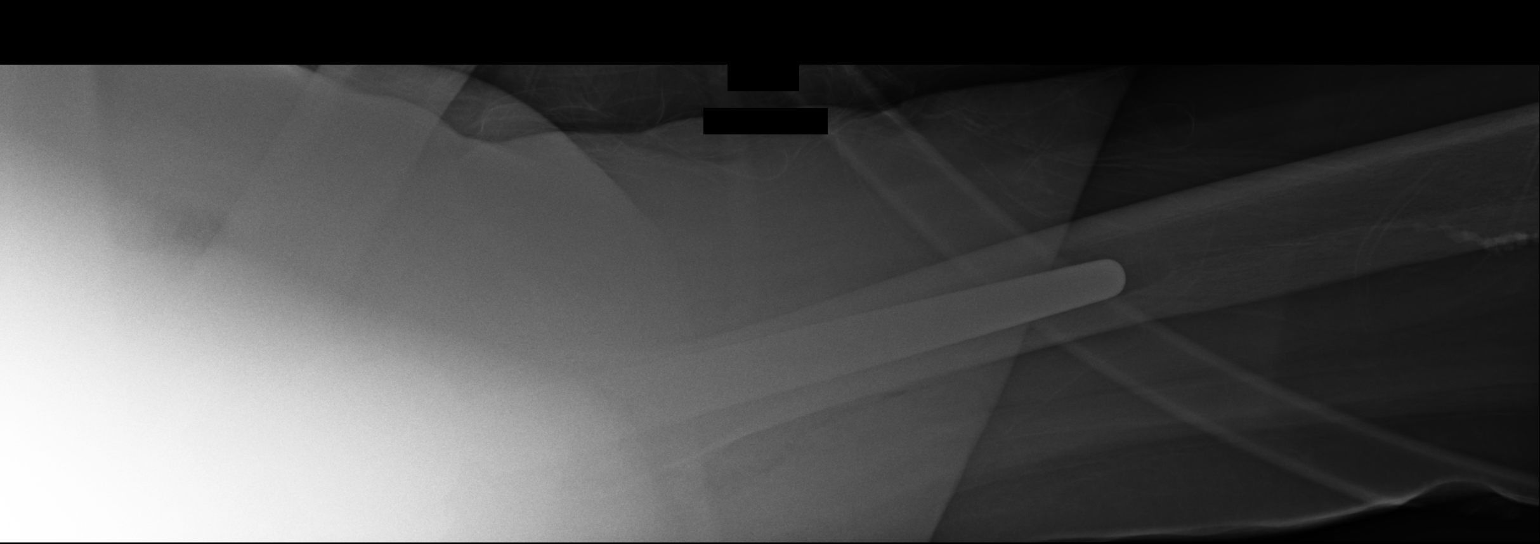

[1 of 1 positions shown; findings below may reference images not displayed]

FINDINGS: The patient has a new left total hip arthroplasty. The device is
located and there is no fracture.
IMPRESSION: Left total hip replacement.  No acute finding.

## 2016-05-17 ENCOUNTER — Ambulatory Visit: Payer: Self-pay | Admitting: Allergy and Immunology

## 2016-05-21 ENCOUNTER — Ambulatory Visit (INDEPENDENT_AMBULATORY_CARE_PROVIDER_SITE_OTHER): Payer: Medicare Other | Admitting: Allergy and Immunology

## 2016-05-21 ENCOUNTER — Encounter: Payer: Self-pay | Admitting: Allergy and Immunology

## 2016-05-21 VITALS — BP 140/80 | HR 80 | Temp 98.2°F | Resp 20 | Ht 64.2 in | Wt 120.0 lb

## 2016-05-21 DIAGNOSIS — M3500 Sicca syndrome, unspecified: Secondary | ICD-10-CM

## 2016-05-21 DIAGNOSIS — K146 Glossodynia: Secondary | ICD-10-CM

## 2016-05-21 DIAGNOSIS — Z91018 Allergy to other foods: Secondary | ICD-10-CM

## 2016-05-21 DIAGNOSIS — K140 Glossitis: Secondary | ICD-10-CM | POA: Diagnosis not present

## 2016-05-21 MED ORDER — MONTELUKAST SODIUM 10 MG PO TABS: ORAL_TABLET | ORAL | 5 refills | Status: DC

## 2016-05-21 MED ORDER — METHYLPREDNISOLONE ACETATE 80 MG/ML IJ SUSP
80.0000 mg | Freq: Once | INTRAMUSCULAR | Status: AC
  Administered 2016-05-21: 80 mg via INTRAMUSCULAR

## 2016-05-21 MED ORDER — ALPRAZOLAM 0.25 MG PO TABS: ORAL_TABLET | ORAL | 0 refills | Status: DC

## 2016-05-21 MED ORDER — RANITIDINE HCL 300 MG PO TABS: ORAL_TABLET | ORAL | 5 refills | Status: DC

## 2016-05-21 NOTE — Patient Instructions (Addendum)
  1. Allergen avoidance measures?  2. Blood - CBC w/diff, CMP, TSH, T4, TP, SED, ANA w/reflex, IgA/G/M/E, [place] nut IgE  3. Treat inflammation:   A. Depo-Medrol 80 IM delivered in clinic today  B. montelukast 10 mg tablet - one tablet one time per day  4. Treat possible reflux:   A. ranitidine 300 mg tablet - one tablet one time per day  B. Taper down caffeine use  5. Can continue OTC Biotene mouth washes  6. Can apply OTC hydrocortisone 1% ointment to lips twice a day  7. For anxiety issue may use Xanax 0.25 mg tablet up to two times a day if needed. # 20, no refill. DRUG WARNING DISCUSSED.  8. Return to clinic in 2 weeks

## 2016-05-21 NOTE — Progress Notes (Signed)
Dear Dr. Selena Batten,  Thank you for referring Lovina Reach to the [facility] on 05/21/2016.   Below is a summation of this patient's evaluation and recommendations.  Thank you for your referral. I will keep you informed about this patient's response to treatment.   If you have any questions please do not hesitate to contact me.   Sincerely,  Jiles Prows, MD [place]   ______________________________________________________________________    NEW PATIENT NOTE  Referring Provider: Maris Berger, MD Primary Provider: Maris Berger, MD Date of office visit: 05/21/2016    Subjective:   Chief Complaint:  Hannah Schwartz (DOB: 1930-11-29) is a 82 y.o. female who presents to the clinic on 05/21/2016 with a chief complaint of Other (Mouth/lips are raw/burns x 3 weeks) .     HPI: Nate presents to this clinic in evaluation of problems with her mouth over the course of the past month. Apparently she has intense burning in her mouth and intense burning of her lips and her lips become very chapped and red and swollen. She has no other associated systemic or constitutional symptoms with this episode although over the course of the past week she did develop a runny nose and clear rhinorrhea and a little bit of cough. She can still smell. Taste is altered as everything she eats burns her mouth.  She has a long history of dry mouth and her dentist has recommended to her that she use Biotene mouthwash to help with this issue. She's not really sure she's had problems with dry nose or dry eyes in the past. She has not had any associated rheumatologic symptoms with her dry mouth issue.  She really notes no obvious provoking factor giving rise to this issue. She has no associated systemic or constitutional symptoms. There has not been a significant environmental change or diet change or medication change. She  does drink sun drop caffeinated drink every morning and has tea about 3 times per week and drinks decaffeinated coffee every day. She has not been having any significant issue suggesting that reflux is a problem. [facility] which did not help her. She has seen Dr. Dema Severin who gave her an antibacterial mouthwash which did not help her. She's been given Magic mouthwash by Dr. Dema Severin which did not really help her.  This issue has really developed into a problem associated with anxiety and she is asking for some Xanax to help with this issue.  Past Medical History:  Diagnosis Date  . Arthritis   . Cancer ([place])    uterine  . UTI (lower urinary tract infection)     Past Surgical History:  Procedure Laterality Date  . ABDOMINAL HYSTERECTOMY    . BREAST SURGERY Right    bx  . HIP FRACTURE SURGERY Right 13   split-scews  . hydraditaform    . PARATHYROIDECTOMY  14  . TOTAL HIP ARTHROPLASTY Left 07/25/2013   Procedure: TOTAL HIP ARTHROPLASTY;  Surgeon: Ninetta Lights, MD;  Location: [facility];  Service: Orthopedics;  Laterality: Left;      Medication List      aspirin EC 325 MG tablet Take 1 tablet (325 mg total) by mouth daily.   b complex vitamins tablet Take 1 tablet by mouth daily.   calcium-vitamin D 500-200 MG-UNIT tablet Commonly known as:  OSCAL WITH D  Take 1 tablet by mouth daily with breakfast.   Ginkgo Biloba 60 MG Caps Take 60 mg by mouth daily.   Glucosamine Sulfate 1000 MG Caps Take 1,000 mg by mouth daily.   HAIR/SKIN/NAILS PO Take 10,000 mcg by mouth daily.   HYDROcodone-acetaminophen 5-325 MG tablet Commonly known as:  NORCO Take 1-2 tablets by mouth every 4 (four) hours as needed for moderate pain.   methocarbamol 500 MG tablet Commonly known as:  ROBAXIN Take 1 tablet (500 mg total) by mouth 4 (four) times daily.   multivitamin with minerals Tabs tablet Take 1 tablet by mouth daily. Centrum Silver     multivitamin-lutein Caps capsule Take 1 capsule by mouth daily.   ondansetron 4 MG tablet Commonly known as:  ZOFRAN Take 1 tablet (4 mg total) by mouth every 8 (eight) hours as needed for nausea or vomiting.   senna 8.6 MG Tabs tablet Commonly known as:  SENOKOT Take 2 tablets by mouth at bedtime as needed for mild constipation.   triamcinolone cream 0.1 % Commonly known as:  KENALOG Apply 1 application topically at bedtime as needed (itching).   vitamin E 400 UNIT capsule Take 400 Units by mouth daily.       Allergies  Allergen Reactions  . Bactrim [Sulfamethoxazole-Trimethoprim] Itching  . Citalopram Itching  . Codeine Nausea Only  . Sulfa Antibiotics Itching and Rash    Review of systems negative except as noted in HPI / PMHx or noted below:  Review of Systems  Constitutional: Negative.   HENT: Negative.   Eyes: Negative.   Respiratory: Negative.   Cardiovascular: Negative.   Gastrointestinal: Negative.   Genitourinary: Negative.   Musculoskeletal: Negative.   Skin: Negative.   Neurological: Negative.   Endo/Heme/Allergies: Negative.   Psychiatric/Behavioral: Negative.     Family History  Problem Relation Age of Onset  . Allergic rhinitis Neg Hx   . Angioedema Neg Hx   . Asthma Neg Hx   . Atopy Neg Hx   . Eczema Neg Hx   . Immunodeficiency Neg Hx   . Urticaria Neg Hx     Social History   Social History  . Marital status: Married    Spouse name: N/A  . Number of children: N/A  . Years of education: N/A   Occupational History  . Not on file.   Social History Main Topics  . Smoking status: Never Smoker  . Smokeless tobacco: Never Used  . Alcohol use No  . Drug use: No  . Sexual activity: Not on file   Other Topics Concern  . Not on file   Social History Narrative  . No narrative on file    Environmental and Social history  Lives in a house with a dry environment, no animals located inside the household, carpeting in the bedroom,  plastic on the bed and pillow, and no smoking ongoing with inside the household.  Objective:   Vitals:   05/21/16 0912  BP: 140/80  Pulse: 80  Resp: 20  Temp: 98.2 F (36.8 C)   Height: 5' 4.2" (163.1 cm) Weight: 120 lb (54.4 kg)  Physical Exam  Constitutional: She is well-developed, well-nourished, and in no distress.  HENT:  Head: Normocephalic. Head is without right periorbital erythema and without left periorbital erythema.  Right Ear: Tympanic membrane, external ear and ear canal normal.  Left Ear: Tympanic membrane, external ear and ear canal normal.  Nose: Nose normal. No mucosal edema or rhinorrhea.  Mouth/Throat: Oropharynx is clear and  moist and mucous membranes are normal. No oropharyngeal exudate.  Oral cavity without any significant ulcerations or erythema or coatings.  Eyes: Conjunctivae and lids are normal. Pupils are equal, round, and reactive to light.  Neck: Trachea normal. No tracheal deviation present. No thyromegaly present.  Cardiovascular: Normal rate, regular rhythm, S1 normal, S2 normal and normal heart sounds.   No murmur heard. Pulmonary/Chest: Effort normal. No stridor. No tachypnea. No respiratory distress. She has no wheezes. She has no rales. She exhibits no tenderness.  Abdominal: Soft. She exhibits no distension and no mass. There is no hepatosplenomegaly. There is no tenderness. There is no rebound and no guarding.  Musculoskeletal: She exhibits no edema or tenderness.  Lymphadenopathy:       Head (right side): No tonsillar adenopathy present.       Head (left side): No tonsillar adenopathy present.    She has no cervical adenopathy.    She has no axillary adenopathy.  Neurological: She is alert. Gait normal.  Skin: Rash (slight erythema and slight induration of upper and lower lips.) noted. She is not diaphoretic. No erythema. No pallor. Nails show no clubbing.  Psychiatric: Mood and affect normal.    Diagnostics: Allergy skin tests were  performed. She did not demonstrate any hypersensitivity against a screening panel of foods other than sensitivity directed against [place] nut.   Assessment and Plan:    1. Sicca, unspecified type ([place])   2. Glossitis   3. Burning mouth syndrome   4. Food allergy     1. Allergen avoidance measures?  2. Blood - CBC w/diff, CMP, TSH, T4, TP, SED, ANA w/reflex, IgA/G/M/E, [place] nut IgE  3. Treat inflammation:   A. Depo-Medrol 80 IM delivered in clinic today  B. montelukast 10 mg tablet - one tablet one time per day  4. Treat possible reflux:   A. ranitidine 300 mg tablet - one tablet one time per day  B. Taper down caffeine use  5. Can continue OTC Biotene mouth washes  6. Can apply OTC hydrocortisone 1% ointment to lips twice a day  7. For anxiety issue may use Xanax 0.25 mg tablet up to two times a day if needed. # 20, no refill. DRUG WARNING DISCUSSED.  8. Return to clinic in 2 weeks  Is not entirely clear why Shandy has inflammation of her oral cavity and lips but we need to consider the possibility that she has a systemic disease contributing to this issue and we'll check the screening blood tests noted above to look at causes of inflammation including a possible gammopathy / amyloid or the onset of Sjogren syndrome especially given her history of dry mouth.. Her lack of response to acyclovir suggest that this is probably not herpetic in nature or in fact infectious to any large degree. I will be treating her with a systemic steroid and a leukotriene modifier and to cover the possibility that reflux is also precipitating some of this issue I will treat her with an H2 receptor blocker and have her taper down her daily caffeine use. I will regroup with her in 2 weeks or earlier if there is a problem. She does have a fair amount of anxiety about this issue and is asking for some Xanax and I've given her low dose for the next few weeks. She appears to have a good understanding about  this medication and she's used it in the past for short-term anxiety.  Jiles Prows, MD [facility]

## 2016-05-23 LAB — CBC WITH DIFFERENTIAL/PLATELET
BASOS ABS: 0 10*3/uL (ref 0.0–0.2)
Basos: 1 %
EOS (ABSOLUTE): 0 10*3/uL (ref 0.0–0.4)
Eos: 0 %
HEMOGLOBIN: 12.5 g/dL (ref 11.1–15.9)
Hematocrit: 38.4 % (ref 34.0–46.6)
Immature Grans (Abs): 0 10*3/uL (ref 0.0–0.1)
Immature Granulocytes: 0 %
LYMPHS ABS: 1.3 10*3/uL (ref 0.7–3.1)
Lymphs: 24 %
MCH: 31 pg (ref 26.6–33.0)
MCHC: 32.6 g/dL (ref 31.5–35.7)
MCV: 95 fL (ref 79–97)
MONOCYTES: 5 %
Monocytes Absolute: 0.3 10*3/uL (ref 0.1–0.9)
NEUTROS ABS: 3.8 10*3/uL (ref 1.4–7.0)
Neutrophils: 70 %
Platelets: 329 10*3/uL (ref 150–379)
RBC: 4.03 x10E6/uL (ref 3.77–5.28)
RDW: 14.2 % (ref 12.3–15.4)
WBC: 5.5 10*3/uL (ref 3.4–10.8)

## 2016-05-23 LAB — COMPREHENSIVE METABOLIC PANEL
A/G RATIO: 1.9 (ref 1.2–2.2)
ALBUMIN: 4.8 g/dL — AB (ref 3.5–4.7)
ALK PHOS: 62 IU/L (ref 39–117)
ALT: 15 IU/L (ref 0–32)
AST: 20 IU/L (ref 0–40)
BILIRUBIN TOTAL: 0.3 mg/dL (ref 0.0–1.2)
BUN/Creatinine Ratio: 15 (ref 12–28)
BUN: 12 mg/dL (ref 8–27)
CO2: 28 mmol/L (ref 18–29)
Calcium: 10.9 mg/dL — ABNORMAL HIGH (ref 8.7–10.3)
Chloride: 97 mmol/L (ref 96–106)
Creatinine, Ser: 0.8 mg/dL (ref 0.57–1.00)
GFR calc Af Amer: 79 mL/min/{1.73_m2} (ref >59)
GFR calc non Af Amer: 68 mL/min/{1.73_m2} (ref >59)
GLUCOSE: 101 mg/dL — AB (ref 65–99)
Globulin, Total: 2.5 g/dL (ref 1.5–4.5)
POTASSIUM: 5.2 mmol/L (ref 3.5–5.2)
SODIUM: 144 mmol/L (ref 134–144)
Total Protein: 7.3 g/dL (ref 6.0–8.5)

## 2016-05-23 LAB — ANA W/REFLEX IF POSITIVE: Anti Nuclear Antibody(ANA): NEGATIVE

## 2016-05-23 LAB — SEDIMENTATION RATE: Sed Rate: 12 mm/hr (ref 0–40)

## 2016-05-23 LAB — T4, FREE: Free T4: 1.31 ng/dL (ref 0.82–1.77)

## 2016-05-23 LAB — THYROID PEROXIDASE ANTIBODY: Thyroperoxidase Ab SerPl-aCnc: 22 IU/mL (ref 0–34)

## 2016-05-23 LAB — TSH: TSH: 2.35 u[IU]/mL (ref 0.450–4.500)

## 2016-06-10 ENCOUNTER — Ambulatory Visit: Payer: Medicare Other | Admitting: Allergy and Immunology

## 2016-06-17 ENCOUNTER — Telehealth: Payer: Self-pay | Admitting: *Deleted

## 2016-06-17 NOTE — Telephone Encounter (Signed)
-----   Message from Jiles Prows, MD sent at 05/31/2016  8:17 AM EST ----- Please inform patient that blood tests are normal other than a slightly elevated calcium. Still waiting for [place] nut test. Will discuss with RV

## 2016-06-17 NOTE — Telephone Encounter (Signed)
Mailed letter to patient's home to call office. 

## 2016-06-28 NOTE — Telephone Encounter (Signed)
Patient informed. 

## 2016-07-12 ENCOUNTER — Encounter: Payer: Self-pay | Admitting: Allergy and Immunology

## 2016-07-12 ENCOUNTER — Ambulatory Visit (INDEPENDENT_AMBULATORY_CARE_PROVIDER_SITE_OTHER): Payer: Medicare Other | Admitting: Allergy and Immunology

## 2016-07-12 VITALS — BP 134/76 | HR 80 | Resp 16

## 2016-07-12 DIAGNOSIS — K219 Gastro-esophageal reflux disease without esophagitis: Secondary | ICD-10-CM | POA: Diagnosis not present

## 2016-07-12 DIAGNOSIS — K146 Glossodynia: Secondary | ICD-10-CM | POA: Diagnosis not present

## 2016-07-12 DIAGNOSIS — K140 Glossitis: Secondary | ICD-10-CM | POA: Diagnosis not present

## 2016-07-12 DIAGNOSIS — M3500 Sicca syndrome, unspecified: Secondary | ICD-10-CM

## 2016-07-12 NOTE — Progress Notes (Signed)
Follow-up Note  Referring Provider: Maris Berger, MD Primary Provider: Maris Berger, MD Date of Office Visit: 07/12/2016  Subjective:   Hannah Schwartz (DOB: [birth year]) is a 83 y.o. female who returns to the [facility] on 07/12/2016 in re-evaluation of the following:  HPI: Da returns to this clinic in reevaluation of her burning mouth syndrome and sicca syndrome. I last saw her in his clinic during her initial evaluation of December 2017 at which time we try to address possible inflammatory causes of her condition and reflux-induced disease.  She is normal now. She has resolved all the issues with her mouth and her lips. She has tapered off her montelukast yet continues to use her ranitidine and uses some occasional hydrocortisone ointment to her lips and continues on as needed use of Biotene. She has tapered down her caffeine and is only drinking 1 cup of coffee per day which presently is a 50-50 mix.  Allergies as of 07/12/2016      Reactions   Bactrim [sulfamethoxazole-trimethoprim] Itching   Citalopram Itching   Codeine Nausea Only   Sulfa Antibiotics Itching, Rash      Medication List      ALPRAZolam 0.25 MG tablet Commonly known as:  XANAX Take one tablet once or twice daily only if needed   aspirin EC 325 MG tablet Take 1 tablet (325 mg total) by mouth daily.   b complex vitamins tablet Take 1 tablet by mouth daily.   B-12 1000 MCG Tbcr Take by mouth.   FISH OIL PO Take by mouth.   Ginkgo Biloba 60 MG Caps Take 60 mg by mouth daily.   Glucosamine Sulfate 1000 MG Caps Take 1,000 mg by mouth daily.   montelukast 10 MG tablet Commonly known as:  SINGULAIR Take one tablet once daily   multivitamin with minerals Tabs tablet Take 1 tablet by mouth daily. Centrum Silver   multivitamin-lutein Caps capsule Take 1 capsule by mouth daily.   vitamin E 400 UNIT capsule Take 400 Units by mouth daily.       Past Medical History:    Diagnosis Date  . Arthritis   . Cancer ([place])    uterine  . UTI (lower urinary tract infection)     Past Surgical History:  Procedure Laterality Date  . ABDOMINAL HYSTERECTOMY    . BREAST SURGERY Right    bx  . HIP FRACTURE SURGERY Right 13   split-scews  . hydraditaform    . PARATHYROIDECTOMY  14  . TOTAL HIP ARTHROPLASTY Left 07/25/2013   Procedure: TOTAL HIP ARTHROPLASTY;  Surgeon: Ninetta Lights, MD;  Location: [facility];  Service: Orthopedics;  Laterality: Left;    Review of systems negative except as noted in HPI / PMHx or noted below:  Review of Systems  Constitutional: Negative.   HENT: Negative.   Eyes: Negative.   Respiratory: Negative.   Cardiovascular: Negative.   Gastrointestinal: Negative.   Genitourinary: Negative.   Musculoskeletal: Negative.   Skin: Negative.   Neurological: Negative.   Endo/Heme/Allergies: Negative.   Psychiatric/Behavioral: Negative.      Objective:   Vitals:   07/12/16 1327  BP: 134/76  Pulse: 80  Resp: 16          Physical Exam  Constitutional: She is well-developed, well-nourished, and in no distress.  HENT:  Head: Normocephalic.  Right Ear: Tympanic membrane, external ear and ear canal normal.  Left Ear: Tympanic membrane, external ear and ear canal normal.  Nose: Nose normal. No mucosal edema or rhinorrhea.  Mouth/Throat: Uvula is midline, oropharynx is clear and moist and mucous membranes are normal. No oropharyngeal exudate.  Eyes: Conjunctivae are normal.  Neck: Trachea normal. No tracheal tenderness present. No tracheal deviation present. No thyromegaly present.  Cardiovascular: Normal rate, regular rhythm, S1 normal, S2 normal and normal heart sounds.   No murmur heard. Pulmonary/Chest: Breath sounds normal. No stridor. No respiratory distress. She has no wheezes. She has no rales.  Lymphadenopathy:       Head (right side): No tonsillar adenopathy present.       Head (left side): No tonsillar adenopathy present.     She has no cervical adenopathy.  Neurological: She is alert. Gait normal.  Skin: No rash noted. She is not diaphoretic. No erythema. Nails show no clubbing.  Psychiatric: Mood and affect normal.    Diagnostics:    Results of blood tests obtained on 05/21/2016 identified normal hepatic and renal function, calcium of 10.9, white blood cell count 5.5, hemoglobin 12.5, platelet 329, normal differential,, normal thyroid function tests, negative ANA  Assessment and Plan:   1. Sicca, unspecified type ([place])   2. Glossitis   3. Burning mouth syndrome   4. Hypercalcemia   5. Gastroesophageal reflux disease, esophagitis presence not specified     1. Blood - Calcium, phosphorus, PTH  2. Ranitidine 300 mg tablet - one tablet one time per day  3. Can continue OTC Biotene mouth washes  4. Can apply OTC hydrocortisone 1% ointment to lips twice a day  5.. Return to clinic in 12 weeks or earlier if problem  Fortunately, Desirre appears to have resolved the issue with her mouth and lips on her current plan. I'm going to have her continue to use ranitidine and consolidate her caffeine consumption for what may have been a reflux-induced problem giving rise to this issue and she has the option of continuing on Biotene and topical hydrocortisone. In investigation of her mild hypercalcemia I will obtain the blood tests noted above. I would like to see her back in this clinic in 12 weeks or earlier. She'll contact me should there be a significant problem as she moves forward.  Allena Katz, MD [place]

## 2016-07-12 NOTE — Patient Instructions (Signed)
  1. Blood - Calcium, phosphorus, PTH  2. Ranitidine 300 mg tablet - one tablet one time per day  3. Can continue OTC Biotene mouth washes  4. Can apply OTC hydrocortisone 1% ointment to lips twice a day  5.. Return to clinic in 12 weeks or earlier if problem

## 2016-07-13 ENCOUNTER — Telehealth: Payer: Self-pay

## 2016-07-13 NOTE — Telephone Encounter (Signed)
Yes, she may of only had 1 parathyroid removed

## 2016-07-13 NOTE — Telephone Encounter (Signed)
Patient notified

## 2016-07-13 NOTE — Telephone Encounter (Signed)
You ordered a PTH  For labwork. Patient states that she has had her parathyroid removed. Does she still need this test?

## 2016-07-14 LAB — PTH, INTACT AND CALCIUM
Calcium: 9.6 mg/dL (ref 8.7–10.3)
PTH: 36 pg/mL (ref 15–65)

## 2016-07-14 LAB — PHOSPHORUS: Phosphorus: 3.5 mg/dL (ref 2.5–4.5)

## 2016-09-23 ENCOUNTER — Ambulatory Visit (INDEPENDENT_AMBULATORY_CARE_PROVIDER_SITE_OTHER): Payer: Medicare Other | Admitting: Allergy and Immunology

## 2016-09-23 ENCOUNTER — Encounter: Payer: Self-pay | Admitting: Allergy and Immunology

## 2016-09-23 VITALS — BP 126/64 | HR 76 | Resp 20

## 2016-09-23 DIAGNOSIS — K146 Glossodynia: Secondary | ICD-10-CM | POA: Diagnosis not present

## 2016-09-23 DIAGNOSIS — K219 Gastro-esophageal reflux disease without esophagitis: Secondary | ICD-10-CM | POA: Diagnosis not present

## 2016-09-23 DIAGNOSIS — M3500 Sicca syndrome, unspecified: Secondary | ICD-10-CM

## 2016-09-23 DIAGNOSIS — K140 Glossitis: Secondary | ICD-10-CM

## 2016-09-23 MED ORDER — RANITIDINE HCL 300 MG PO TABS: 300.0000 mg | ORAL_TABLET | Freq: Every day | ORAL | 5 refills | Status: DC

## 2016-09-23 MED ORDER — MONTELUKAST SODIUM 10 MG PO TABS: ORAL_TABLET | ORAL | 5 refills | Status: DC

## 2016-09-23 MED ORDER — DESONIDE 0.05 % EX OINT: 1.0000 "application " | TOPICAL_OINTMENT | Freq: Two times a day (BID) | CUTANEOUS | 0 refills | Status: DC

## 2016-09-23 NOTE — Patient Instructions (Addendum)
  1. Restart montelukast 10mg  - one tablet one time per day  2. Restart Ranitidine 300 mg tablet - one tablet one time per day  3. Can continue OTC Biotene mouth washes  4. Can apply desonide 0.05% ointment to lips twice a day if needed  5. Return to clinic in 12 weeks or earlier if problem

## 2016-09-23 NOTE — Progress Notes (Signed)
Follow-up Note  Referring Provider: Maris Berger, MD Primary Provider: Maris Berger, MD Date of Office Visit: 09/23/2016  Subjective:   Hannah Schwartz (DOB: [birth year]) is a 83 y.o. female who returns to the [facility] on 09/23/2016 in re-evaluation of the following:  HPI: Garielle returns to this clinic in reevaluation of her burning mouth syndrome and sicca syndrome. I last saw her in this clinic January 2018.  Although she had an excellent response to medical therapy regarding these issues in the fall and winter of 2018 she stopped all her medications and has redeveloped burning after eating. It does not really matter what she eats. This has occurred with meat loaf and gravy with sausage and peanut butter and crackers. But every time she eats she gets burning inside her mouth and her lips get somewhat irritated as well. She is no longer using any of the medications prescribed previously.  Allergies as of 09/23/2016      Reactions   Bactrim [sulfamethoxazole-trimethoprim] Itching   Citalopram Itching   Codeine Nausea Only   Sulfa Antibiotics Itching, Rash      Medication List      ALPRAZolam 0.25 MG tablet Commonly known as:  XANAX Take one tablet once or twice daily only if needed   aspirin EC 325 MG tablet Take 1 tablet (325 mg total) by mouth daily.   b complex vitamins tablet Take 1 tablet by mouth daily.   B-12 1000 MCG Tbcr Take by mouth.   chlorhexidine 0.12 % solution Commonly known as:  PERIDEX SWISH 15 MILLILITERS in THE MOUTH OR throat TWO TIMES daily FOR 14 DAYS.]   desonide 0.05 % ointment Commonly known as:  DESOWEN Apply 1 application topically 2 (two) times daily.   FISH OIL PO Take by mouth.   Ginkgo Biloba 60 MG Caps Take 60 mg by mouth daily.   Glucosamine Sulfate 1000 MG Caps Take 1,000 mg by mouth daily.   montelukast 10 MG tablet Commonly known as:  SINGULAIR Take one tablet once daily   multivitamin with minerals  Tabs tablet Take 1 tablet by mouth daily. Centrum Silver   multivitamin-lutein Caps capsule Take 1 capsule by mouth daily.   ranitidine 300 MG tablet Commonly known as:  ZANTAC Take 1 tablet (300 mg total) by mouth at bedtime.   vitamin E 400 UNIT capsule Take 400 Units by mouth daily.       Past Medical History:  Diagnosis Date  . Arthritis   . Cancer ([place])    uterine  . UTI (lower urinary tract infection)     Past Surgical History:  Procedure Laterality Date  . ABDOMINAL HYSTERECTOMY    . BREAST SURGERY Right    bx  . HIP FRACTURE SURGERY Right 13   split-scews  . hydraditaform    . PARATHYROIDECTOMY  14  . TOTAL HIP ARTHROPLASTY Left 07/25/2013   Procedure: TOTAL HIP ARTHROPLASTY;  Surgeon: Ninetta Lights, MD;  Location: [facility];  Service: Orthopedics;  Laterality: Left;    Review of systems negative except as noted in HPI / PMHx or noted below:  Review of Systems  Constitutional: Negative.   HENT: Negative.   Eyes: Negative.   Respiratory: Negative.   Cardiovascular: Negative.   Gastrointestinal: Negative.   Genitourinary: Negative.   Musculoskeletal: Negative.   Skin: Negative.   Neurological: Negative.   Endo/Heme/Allergies: Negative.   Psychiatric/Behavioral: Negative.      Objective:   Vitals:  09/23/16 1515  BP: 126/64  Pulse: 76  Resp: 20          Physical Exam  Constitutional: She is well-developed, well-nourished, and in no distress.  HENT:  Head: Normocephalic.  Right Ear: Tympanic membrane, external ear and ear canal normal.  Left Ear: Tympanic membrane, external ear and ear canal normal.  Nose: Nose normal. No mucosal edema or rhinorrhea.  Mouth/Throat: Uvula is midline, oropharynx is clear and moist and mucous membranes are normal. No oropharyngeal exudate.  Eyes: Conjunctivae are normal.  Neck: Trachea normal. No tracheal tenderness present. No tracheal deviation present. No thyromegaly present.  Cardiovascular: Normal rate,  regular rhythm, S1 normal, S2 normal and normal heart sounds.   No murmur heard. Pulmonary/Chest: Breath sounds normal. No stridor. No respiratory distress. She has no wheezes. She has no rales.  Musculoskeletal: She exhibits no edema.  Lymphadenopathy:       Head (right side): No tonsillar adenopathy present.       Head (left side): No tonsillar adenopathy present.    She has no cervical adenopathy.  Neurological: She is alert. Gait normal.  Skin: Rash (slight erythema lips bilaterally) noted. She is not diaphoretic. No erythema. Nails show no clubbing.  Psychiatric: Mood and affect normal.    Diagnostics: none  Assessment and Plan:   1. Sicca, unspecified type ([place])   2. Glossitis   3. Burning mouth syndrome   4. Gastroesophageal reflux disease, esophagitis presence not specified     1. Restart montelukast 10mg  - one tablet one time per day  2. Restart Ranitidine 300 mg tablet - one tablet one time per day  3. Can continue OTC Biotene mouth washes  4. Can apply desonide 0.05% ointment to lips twice a day if needed  5. Return to clinic in 12 weeks or earlier if problem  I will have Jacoria restart medications that worked successfully in the past regarding her burning mouth syndrome and irritation of her oral cavity as noted above. She will keep in contact with me noting her response and if she does well I will see her back in this clinic in 12 weeks or earlier if problem.  Allena Katz, MD [facility]

## 2017-05-17 ENCOUNTER — Other Ambulatory Visit: Payer: Self-pay

## 2017-05-17 DIAGNOSIS — I739 Peripheral vascular disease, unspecified: Secondary | ICD-10-CM

## 2017-05-18 ENCOUNTER — Encounter: Payer: Self-pay | Admitting: Vascular Surgery

## 2017-05-18 ENCOUNTER — Encounter: Payer: Medicare Other | Admitting: Surgery

## 2017-05-18 ENCOUNTER — Encounter (HOSPITAL_COMMUNITY): Payer: Medicare Other

## 2017-05-18 ENCOUNTER — Ambulatory Visit (INDEPENDENT_AMBULATORY_CARE_PROVIDER_SITE_OTHER): Payer: Medicare Other | Admitting: Vascular Surgery

## 2017-05-18 ENCOUNTER — Ambulatory Visit (HOSPITAL_COMMUNITY)
Admission: RE | Admit: 2017-05-18 | Discharge: 2017-05-18 | Disposition: A | Discharge status: Home / Self Care | Payer: Medicare Other | Source: Ambulatory Visit | Attending: Vascular Surgery | Admitting: Vascular Surgery

## 2017-05-18 VITALS — BP 127/67 | HR 92 | Temp 97.0°F | Resp 20 | Ht 64.0 in | Wt 117.0 lb

## 2017-05-18 DIAGNOSIS — I739 Peripheral vascular disease, unspecified: Secondary | ICD-10-CM | POA: Diagnosis not present

## 2017-05-18 DIAGNOSIS — L97909 Non-pressure chronic ulcer of unspecified part of unspecified lower leg with unspecified severity: Secondary | ICD-10-CM

## 2017-05-18 DIAGNOSIS — I70299 Other atherosclerosis of native arteries of extremities, unspecified extremity: Secondary | ICD-10-CM | POA: Diagnosis not present

## 2017-05-18 NOTE — Progress Notes (Signed)
Patient name: Hannah Schwartz MRN: [id number] DOB: Feb 07, 1931 Sex: female   REASON FOR CONSULT:    Peripheral vascular disease with left lower extremity ulcer.  The consult is requested by Dr. Salvadore Oxford.  HPI:   Hannah Schwartz is a pleasant 83 y.o. female, who fell in August and developed a wound on her left medial malleolus.  This was related to an injury associated with the fall.  She had no other issues or broken bones.  The wound has been slow to heal.  She has been doing dressing changes with Silvadene and the wound is slowly making progress.  Prior to this event, she denies any history of claudication, rest pain, or nonhealing ulcers.  She really does not have any significant risk factors for peripheral vascular disease.  She denies any history of diabetes, hypertension, hypercholesterolemia, family history of premature cardiovascular disease or smoking history.  I have reviewed the records that were sent from the referring office.  The patient was seen on 05/09/2017 with a persistent left leg ulcer.  This has been present for a couple of weeks.  The patient had evidence of peripheral vascular disease and was sent for vascular consultation.  Past Medical History:  Diagnosis Date  . Arthritis   . Cancer ([place])    uterine  . UTI (lower urinary tract infection)     Family History  Problem Relation Age of Onset  . Allergic rhinitis Neg Hx   . Angioedema Neg Hx   . Asthma Neg Hx   . Atopy Neg Hx   . Eczema Neg Hx   . Immunodeficiency Neg Hx   . Urticaria Neg Hx     SOCIAL HISTORY: She is not a smoker. Social History   Socioeconomic History  . Marital status: Married    Spouse name: Not on file  . Number of children: Not on file  . Years of education: Not on file  . Highest education level: Not on file  Social Needs  . Financial resource strain: Not on file  . Food insecurity - worry: Not on file  . Food insecurity - inability: Not on file  . Transportation needs  - medical: Not on file  . Transportation needs - non-medical: Not on file  Occupational History  . Not on file  Tobacco Use  . Smoking status: Never Smoker  . Smokeless tobacco: Never Used  Substance and Sexual Activity  . Alcohol use: No  . Drug use: No  . Sexual activity: Not on file  Other Topics Concern  . Not on file  Social History Narrative  . Not on file    Allergies  Allergen Reactions  . Bactrim [Sulfamethoxazole-Trimethoprim] Itching  . Citalopram Itching  . Codeine Nausea Only  . Sulfa Antibiotics Itching and Rash    Current Outpatient Medications  Medication Sig Dispense Refill  . ALPRAZolam (XANAX) 0.25 MG tablet Take one tablet once or twice daily only if needed 20 tablet 0  . aspirin EC 325 MG tablet Take 1 tablet (325 mg total) by mouth daily. 30 tablet 0  . b complex vitamins tablet Take 1 tablet by mouth daily.    . chlorhexidine (PERIDEX) 0.12 % solution SWISH 15 MILLILITERS in THE MOUTH OR throat TWO TIMES daily FOR 14 DAYS.]    . Cyanocobalamin (B-12) 1000 MCG TBCR Take by mouth.    . desonide (DESOWEN) 0.05 % ointment Apply 1 application topically 2 (two) times daily. 15 g 0  . Ginkgo  Biloba 60 MG CAPS Take 60 mg by mouth daily.    . Glucosamine Sulfate 1000 MG CAPS Take 1,000 mg by mouth daily.    . montelukast (SINGULAIR) 10 MG tablet Take one tablet once daily 30 tablet 5  . Multiple Vitamin (MULTIVITAMIN WITH MINERALS) TABS tablet Take 1 tablet by mouth daily. Centrum Silver    . multivitamin-lutein (OCUVITE-LUTEIN) CAPS capsule Take 1 capsule by mouth daily.    . Omega-3 Fatty Acids (FISH OIL PO) Take by mouth.    . ranitidine (ZANTAC) 300 MG tablet Take 1 tablet (300 mg total) by mouth at bedtime. 30 tablet 5  . vitamin E 400 UNIT capsule Take 400 Units by mouth daily.     No current facility-administered medications for this visit.     REVIEW OF SYSTEMS:  [X]  denotes positive finding, [ ]  denotes negative finding Cardiac  Comments:  Chest  pain or chest pressure:    Shortness of breath upon exertion:    Short of breath when lying flat:    Irregular heart rhythm:        Vascular    Pain in calf, thigh, or hip brought on by ambulation:    Pain in feet at night that wakes you up from your sleep:     Blood clot in your veins:    Leg swelling:  X       Pulmonary    Oxygen at home:    Productive cough:     Wheezing:         Neurologic    Sudden weakness in arms or legs:     Sudden numbness in arms or legs:     Sudden onset of difficulty speaking or slurred speech:    Temporary loss of vision in one eye:     Problems with dizziness:         Gastrointestinal    Blood in stool:     Vomited blood:         Genitourinary    Burning when urinating:     Blood in urine:        Psychiatric    Major depression:         Hematologic    Bleeding problems:    Problems with blood clotting too easily:        Skin    Rashes or ulcers:        Constitutional    Fever or chills:     PHYSICAL EXAM:   Vitals:   05/18/17 1302  Weight: 117 lb (53.1 kg)    GENERAL: The patient is a well-nourished female, in no acute distress. The vital signs are documented above. CARDIAC: There is a regular rate and rhythm.  VASCULAR: I do not detect carotid bruits. She has palpable femoral, popliteal, and dorsalis pedis pulses bilaterally. I cannot palpate posterior tibial pulses.  However, she has biphasic posterior tibial signals with the Doppler bilaterally. She has telangiectasias and spider veins bilaterally consistent with chronic venous insufficiency. She has no significant lower extremity swelling. PULMONARY: There is good air exchange bilaterally without wheezing or rales. ABDOMEN: Soft and non-tender with normal pitched bowel sounds.  I do not palpate an abdominal aortic aneurysm. MUSCULOSKELETAL: There are no major deformities or cyanosis. NEUROLOGIC: No focal weakness or paresthesias are detected. SKIN: She has a very  superficial wound on the medial distal left leg adjacent to the malleolus which is about 4 cm in length and a couple millimeters in width. PSYCHIATRIC:  The patient has a normal affect.  DATA:    LABS: The most recent creatinine I can find was on 05/21/2016.  Creatinine was 0.8.  ARTERIAL DOPPLER STUDY: I ordered an arterial Doppler study to further assess her circulation today.  I have independently interpreted that study.  Because of her wound on her leg she would not tolerate a cough but was unable to obtain toe pressures.  Toe pressure on the right is 108 mmHg.  Toe pressure on the left is 110 mmHg.  MEDICAL ISSUES:   ULCER LEFT LOWER EXTREMITY: Based on my assessment I think she has adequate circulation to heal the wound on her left leg.  She has a palpable dorsalis pedis pulse, a biphasic posterior tibial signal with the Doppler, and a toe pressure on the left 110 mmHg.  I have explained that a toe pressure of greater than 50 is usually adequate to heal a wound.  She does have evidence of venous insufficiency and therefore I think she would benefit from some compression and elevation.  Given her age and somewhat fragile skin however I would not be overly aggressive with the compression therapy and bandaging.  We placed an ace wrap today with mild compression because the wound was tender.  As the pain improves I think she can apply more pressure.  We have also discussed the importance of intermittent leg elevation and the proper positioning for this.  I will be happy to see her back at any time if any new vascular issues arise.  At this point I would not recommend arteriography given that I think she has adequate circulation.  Deitra Mayo [facility] [phone]

## 2017-08-01 ENCOUNTER — Other Ambulatory Visit: Payer: Self-pay | Admitting: *Deleted

## 2017-08-01 ENCOUNTER — Telehealth: Payer: Self-pay | Admitting: Allergy and Immunology

## 2017-08-01 MED ORDER — DESONIDE 0.05 % EX OINT: TOPICAL_OINTMENT | CUTANEOUS | 2 refills | Status: DC

## 2017-08-01 NOTE — Telephone Encounter (Signed)
Rx for desonide sent to pharmacy.

## 2017-08-01 NOTE — Telephone Encounter (Signed)
Hannah Schwartz called in and would like a refill prescription sent to [facility] for [place].

## 2017-08-01 NOTE — Telephone Encounter (Signed)
Chosen was not prescribed LIDEX and insists it is LIDEX but Dr. Neldon Mc did state in last note to apply Desonide to lips and that is what she would like a refill of.

## 2017-08-15 ENCOUNTER — Encounter: Payer: Self-pay | Admitting: Allergy and Immunology

## 2017-08-15 ENCOUNTER — Ambulatory Visit (INDEPENDENT_AMBULATORY_CARE_PROVIDER_SITE_OTHER): Payer: Medicare Other | Admitting: Allergy and Immunology

## 2017-08-15 VITALS — BP 150/70 | HR 84 | Resp 20

## 2017-08-15 DIAGNOSIS — M3500 Sicca syndrome, unspecified: Secondary | ICD-10-CM | POA: Diagnosis not present

## 2017-08-15 DIAGNOSIS — K140 Glossitis: Secondary | ICD-10-CM

## 2017-08-15 DIAGNOSIS — K146 Glossodynia: Secondary | ICD-10-CM

## 2017-08-15 DIAGNOSIS — K219 Gastro-esophageal reflux disease without esophagitis: Secondary | ICD-10-CM | POA: Diagnosis not present

## 2017-08-15 MED ORDER — MONTELUKAST SODIUM 10 MG PO TABS: 10.0000 mg | ORAL_TABLET | Freq: Every day | ORAL | 5 refills | Status: DC

## 2017-08-15 MED ORDER — VALACYCLOVIR HCL 500 MG PO TABS: 500.0000 mg | ORAL_TABLET | Freq: Two times a day (BID) | ORAL | 0 refills | Status: DC

## 2017-08-15 MED ORDER — RANITIDINE HCL 300 MG PO TABS: 300.0000 mg | ORAL_TABLET | Freq: Every day | ORAL | 5 refills | Status: DC

## 2017-08-15 MED ORDER — DESONIDE 0.05 % EX OINT: 1.0000 "application " | TOPICAL_OINTMENT | Freq: Two times a day (BID) | CUTANEOUS | 3 refills | Status: DC | PRN

## 2017-08-15 NOTE — Patient Instructions (Addendum)
  1. Restart montelukast 10mg  - one tablet one time per day  2. Restart Ranitidine 300 mg tablet - one tablet one time per day  3. Use continue OTC Biotene mouth washes  4. Can apply desonide 0.05% ointment to lips twice a day if needed  5. Valtrex 500 mg two times per day for 10 days  5. Further treatment?

## 2017-08-15 NOTE — Progress Notes (Signed)
Follow-up Note  Referring Provider: Maris Berger, MD Primary Provider: Maris Berger, MD Date of Office Visit: 08/15/2017  Subjective:   Hannah Schwartz (DOB: Sep 26, 1930) is a 84 y.o. female who returns to the [facility] on 08/15/2017 in re-evaluation of the following:  HPI: Reyah presents to this clinic in reevaluation of her stomatitis and burning mouth syndrome.  I have not seen her in this clinic since 23 September 2016.  She states that she was doing very well without any problems with her mouth until 2 weeks ago.  At that point in time she started to develop "ulcers" on her lips and her mouth is burning and her tongue hurts.  There is no associated systemic or constitutional symptoms.  She does not use any of the medications that I prescribed in the past.  She went to the urgent care center on 02 August 2017 and was treated with Magic mouthwash and triamcinolone topical agent which has not helped her.  Allergies as of 08/15/2017      Reactions   Bactrim [sulfamethoxazole-trimethoprim] Itching   Citalopram Itching   Codeine Nausea Only   Sulfa Antibiotics Itching, Rash      Medication List      ALPRAZolam 0.25 MG tablet Commonly known as:  XANAX Take one tablet once or twice daily only if needed   aspirin EC 325 MG tablet Take 1 tablet (325 mg total) by mouth daily.   b complex vitamins tablet Take 1 tablet by mouth daily.   B-12 1000 MCG Tbcr Take by mouth.   chlorhexidine 0.12 % solution Commonly known as:  PERIDEX SWISH 15 MILLILITERS in THE MOUTH OR throat TWO TIMES daily FOR 14 DAYS.]   FISH OIL PO Take by mouth.   Ginkgo Biloba 60 MG Caps Take 60 mg by mouth daily.   Glucosamine Sulfate 1000 MG Caps Take 1,000 mg by mouth daily.   magic mouthwash w/lidocaine Soln Take 5 mLs by mouth daily.   multivitamin with minerals Tabs tablet Take 1 tablet by mouth daily. Centrum Silver   multivitamin-lutein Caps capsule Take 1 capsule by  mouth daily.   TRIAMCINOLONE ACETONIDE (TOP) 0.05 % Oint Apply 1 application topically 4 (four) times daily as needed.   vitamin E 400 UNIT capsule Take 400 Units by mouth daily.       Past Medical History:  Diagnosis Date  . Arthritis   . Cancer ([place])    uterine  . UTI (lower urinary tract infection)     Past Surgical History:  Procedure Laterality Date  . ABDOMINAL HYSTERECTOMY    . BREAST SURGERY Right    bx  . HIP FRACTURE SURGERY Right 13   split-scews  . hydraditaform    . PARATHYROIDECTOMY  16  . TOTAL HIP ARTHROPLASTY Left 07/25/2013   Procedure: TOTAL HIP ARTHROPLASTY;  Surgeon: Ninetta Lights, MD;  Location: [facility];  Service: Orthopedics;  Laterality: Left;    Review of systems negative except as noted in HPI / PMHx or noted below:  Review of Systems  Constitutional: Negative.   HENT: Negative.   Eyes: Negative.   Respiratory: Negative.   Cardiovascular: Negative.   Gastrointestinal: Negative.   Genitourinary: Negative.   Musculoskeletal: Negative.   Skin: Negative.   Neurological: Negative.   Endo/Heme/Allergies: Negative.   Psychiatric/Behavioral: Negative.      Objective:   Vitals:   08/15/17 1348  BP: (!) 150/70  Pulse: 84  Resp: 20  Physical Exam  Constitutional: She is well-developed, well-nourished, and in no distress.  HENT:  Head: Normocephalic.  Right Ear: Tympanic membrane, external ear and ear canal normal.  Left Ear: Tympanic membrane, external ear and ear canal normal.  Nose: Nose normal. No mucosal edema or rhinorrhea.  Mouth/Throat: Uvula is midline and oropharynx is clear and moist. Mucous membranes are dry (Several superficial ulcers involving lower lip.  No ulcers found within oral cavity.  No obvious tongue abnormality.). No oropharyngeal exudate.  Eyes: Conjunctivae are normal.  Neck: Trachea normal. No tracheal tenderness present. No tracheal deviation present. No thyromegaly present.  Cardiovascular: Normal  rate, regular rhythm, S1 normal, S2 normal and normal heart sounds.  No murmur heard. Pulmonary/Chest: Breath sounds normal. No stridor. No respiratory distress. She has no wheezes. She has no rales.  Musculoskeletal: She exhibits no edema.  Lymphadenopathy:       Head (right side): No tonsillar adenopathy present.       Head (left side): No tonsillar adenopathy present.    She has no cervical adenopathy.  Neurological: She is alert. Gait normal.  Skin: No rash noted. She is not diaphoretic. No erythema. Nails show no clubbing.  Psychiatric: Mood and affect normal.    Diagnostics:    none  Assessment and Plan:   1. Sicca, unspecified type ([place])   2. Glossitis   3. Burning mouth syndrome   4. Gastroesophageal reflux disease, esophagitis presence not specified     1. Restart montelukast 10mg  - one tablet one time per day  2. Restart Ranitidine 300 mg tablet - one tablet one time per day  3. Use continue OTC Biotene mouth washes  4. Can apply desonide 0.05% ointment to lips twice a day if needed  5. Valtrex 500 mg two times per day for 10 days  5. Further treatment?  I will restart Vaishali on a combination of a leukotriene modifier and an H2 receptor blocker and some low dose topical steroids as this plan was able to eliminate the inflammation of her mouth last year.  She actually did quite well for a year until this most recent event 2 weeks ago.  I will also empirically treat her for possible herpetic infection with Valtrex.  I encouraged her to use a wetting solution for her mouth given her rather significant sicca syndrome.  One must always consider the possibility of pemphigus or pemphigoid or an immuno bullous disease presenting with her complaints although it would be very unusual to have outbreaks with a frequency of 1 or 2 times per year .  For now we will hold off on any evaluation but if it becomes a more serious condition or recurrent condition in the future then we may  need to obtain a biopsy of her mucosal membrane and obtain some blood test looking for immunobullous specific antibodies.  Allena Katz, MD Allergy / Immunology [place]

## 2017-08-16 ENCOUNTER — Encounter: Payer: Self-pay | Admitting: Allergy and Immunology

## 2017-09-19 ENCOUNTER — Ambulatory Visit (INDEPENDENT_AMBULATORY_CARE_PROVIDER_SITE_OTHER): Payer: Medicare Other | Admitting: Allergy and Immunology

## 2017-09-19 VITALS — BP 120/68 | HR 70 | Resp 18

## 2017-09-19 DIAGNOSIS — M3500 Sicca syndrome, unspecified: Secondary | ICD-10-CM | POA: Diagnosis not present

## 2017-09-19 DIAGNOSIS — K146 Glossodynia: Secondary | ICD-10-CM

## 2017-09-19 DIAGNOSIS — K140 Glossitis: Secondary | ICD-10-CM

## 2017-09-19 MED ORDER — VALACYCLOVIR HCL 500 MG PO TABS: 500.0000 mg | ORAL_TABLET | Freq: Two times a day (BID) | ORAL | 0 refills | Status: DC

## 2017-09-19 MED ORDER — RANITIDINE HCL 300 MG PO TABS: 300.0000 mg | ORAL_TABLET | Freq: Every day | ORAL | 5 refills | Status: DC

## 2017-09-19 NOTE — Progress Notes (Signed)
Follow-up Note  Referring Provider: Maris Berger, MD Primary Provider: Maris Berger, MD Date of Office Visit: 09/19/2017  Subjective:   Hannah Schwartz (DOB: 1931/02/08) is a 84 y.o. female who returns to the [facility] on 09/19/2017 in re-evaluation of the following:  HPI: Hannah Schwartz returns to this clinic in reevaluation of her burning mouth syndrome and sicca syndrome and stomatitis.  Her last visit to this clinic was during her most recent outbreak on 15 August 2017.  With therapy prescribed during her last outbreak she felt like she has really done very well and most of her issue had resolved over the course of the past month although she has redeveloped some burning of her lips recently.  She stopped all of her medications.  It should be noted that she almost went 1 year without any issue involving her mouth up until that most recent flareup.  Allergies as of 09/19/2017      Reactions   Bactrim [sulfamethoxazole-trimethoprim] Itching   Citalopram Itching   Codeine Nausea Only   Sulfa Antibiotics Itching, Rash      Medication List      ALPRAZolam 0.25 MG tablet Commonly known as:  XANAX Take one tablet once or twice daily only if needed   aspirin EC 325 MG tablet Take 1 tablet (325 mg total) by mouth daily.   b complex vitamins tablet Take 1 tablet by mouth daily.   B-12 1000 MCG Tbcr Take by mouth.   chlorhexidine 0.12 % solution Commonly known as:  PERIDEX SWISH 15 MILLILITERS in THE MOUTH OR throat TWO TIMES daily FOR 14 DAYS.]   desonide 0.05 % ointment Commonly known as:  DESOWEN Apply 1 application topically 2 (two) times daily as needed.   FISH OIL PO Take by mouth.   Ginkgo Biloba 60 MG Caps Take 60 mg by mouth daily.   Glucosamine Sulfate 1000 MG Caps Take 1,000 mg by mouth daily.   magic mouthwash w/lidocaine Soln Take 5 mLs by mouth daily.   montelukast 10 MG tablet Commonly known as:  SINGULAIR Take 1 tablet (10 mg  total) by mouth at bedtime.   multivitamin with minerals Tabs tablet Take 1 tablet by mouth daily. Centrum Silver   multivitamin-lutein Caps capsule Take 1 capsule by mouth daily.   ranitidine 300 MG tablet Commonly known as:  ZANTAC Take 1 tablet (300 mg total) by mouth daily.   TRIAMCINOLONE ACETONIDE (TOP) 0.05 % Oint Apply 1 application topically 4 (four) times daily as needed.   vitamin E 400 UNIT capsule Take 400 Units by mouth daily.       Past Medical History:  Diagnosis Date  . Arthritis   . Cancer ([place])    uterine  . UTI (lower urinary tract infection)     Past Surgical History:  Procedure Laterality Date  . ABDOMINAL HYSTERECTOMY    . BREAST SURGERY Right    bx  . HIP FRACTURE SURGERY Right 13   split-scews  . hydraditaform    . PARATHYROIDECTOMY  14  . TOTAL HIP ARTHROPLASTY Left 07/25/2013   Procedure: TOTAL HIP ARTHROPLASTY;  Surgeon: Ninetta Lights, MD;  Location: [facility];  Service: Orthopedics;  Laterality: Left;    Review of systems negative except as noted in HPI / PMHx or noted below:  Review of Systems  Constitutional: Negative.   HENT: Negative.   Eyes: Negative.   Respiratory: Negative.   Cardiovascular: Negative.   Gastrointestinal: Negative.  Genitourinary: Negative.   Musculoskeletal: Negative.   Skin: Negative.   Neurological: Negative.   Endo/Heme/Allergies: Negative.   Psychiatric/Behavioral: Negative.      Objective:   Vitals:   09/19/17 1149  BP: 120/68  Pulse: 70  Resp: 18          Physical Exam  HENT:  Mouth/Throat: Uvula is midline, oropharynx is clear and moist and mucous membranes are normal.    Diagnostics: none  Assessment and Plan:   1. Sicca, unspecified type ([place])   2. Glossitis   3. Burning mouth syndrome     1. At onset of mouth issue, use the following:   A. Restart montelukast 10mg  - one tablet one time per day  B. Restart Ranitidine 300 mg tablet - one tablet one time per day  C.  Restart Valtrex 500 mg two times per day for 10 days  2. Use continue OTC Biotene mouth washes if needed  3. Can apply desonide 0.05% ointment to lips twice a day if needed  4. Return to clinic in 1 year or earlier if problem.  Hopefully Hannah Schwartz will go another year without any significant outbreak involving her mouth and I have given her a plan to initiate during her next outbreak.  She will contact me should she have significant problems as she moves forward.  Allena Katz, MD Allergy / Immunology [place]

## 2017-09-19 NOTE — Patient Instructions (Signed)
  1. At onset of mouth issue, use the following:   A. Restart montelukast 10mg  - one tablet one time per day  B. Restart Ranitidine 300 mg tablet - one tablet one time per day  C. Restart Valtrex 500 mg two times per day for 10 days  2. Use continue OTC Biotene mouth washes if needed  3. Can apply desonide 0.05% ointment to lips twice a day if needed  4. Return to clinic in 1 year or earlier if problem.

## 2017-09-20 ENCOUNTER — Encounter: Payer: Self-pay | Admitting: Allergy and Immunology

## 2018-06-05 ENCOUNTER — Telehealth: Payer: Self-pay | Admitting: Allergy and Immunology

## 2018-06-05 MED ORDER — DESONIDE 0.05 % EX OINT: 1.0000 "application " | TOPICAL_OINTMENT | Freq: Two times a day (BID) | CUTANEOUS | 3 refills | Status: DC | PRN

## 2018-06-05 NOTE — Telephone Encounter (Signed)
RX send and patient advised

## 2018-06-05 NOTE — Telephone Encounter (Signed)
Patient called and is requesting a cream for her lips. They are red, irritated, and tender. She said Dr. Neldon Mc called a cream in for her last year, and she would like that again. Last seen 09-19-17. [facility].

## 2019-03-12 ENCOUNTER — Ambulatory Visit: Payer: Medicare Other | Admitting: Allergy and Immunology

## 2019-10-02 ENCOUNTER — Other Ambulatory Visit: Payer: Self-pay | Admitting: Allergy and Immunology

## 2019-10-10 ENCOUNTER — Ambulatory Visit: Payer: Medicare Other | Admitting: Allergy and Immunology

## 2019-10-17 ENCOUNTER — Ambulatory Visit: Payer: Medicare Other | Admitting: Allergy and Immunology

## 2019-12-31 ENCOUNTER — Encounter (HOSPITAL_BASED_OUTPATIENT_CLINIC_OR_DEPARTMENT_OTHER): Payer: Medicare Other | Admitting: Internal Medicine

## 2021-04-06 DIAGNOSIS — I361 Nonrheumatic tricuspid (valve) insufficiency: Secondary | ICD-10-CM

## 2021-04-06 DIAGNOSIS — I340 Nonrheumatic mitral (valve) insufficiency: Secondary | ICD-10-CM

## 2022-10-11 DIAGNOSIS — R269 Unspecified abnormalities of gait and mobility: Secondary | ICD-10-CM | POA: Diagnosis not present

## 2022-10-11 DIAGNOSIS — M6281 Muscle weakness (generalized): Secondary | ICD-10-CM | POA: Diagnosis not present

## 2022-10-11 DIAGNOSIS — F0390 Unspecified dementia without behavioral disturbance: Secondary | ICD-10-CM | POA: Diagnosis not present

## 2022-10-11 DIAGNOSIS — M810 Age-related osteoporosis without current pathological fracture: Secondary | ICD-10-CM | POA: Diagnosis not present

## 2022-10-11 DIAGNOSIS — Z681 Body mass index (BMI) 19 or less, adult: Secondary | ICD-10-CM

## 2022-10-25 DIAGNOSIS — K5900 Constipation, unspecified: Secondary | ICD-10-CM | POA: Diagnosis not present

## 2022-10-25 DIAGNOSIS — F0390 Unspecified dementia without behavioral disturbance: Secondary | ICD-10-CM | POA: Diagnosis not present

## 2022-11-19 DIAGNOSIS — Z681 Body mass index (BMI) 19 or less, adult: Secondary | ICD-10-CM | POA: Diagnosis not present

## 2022-11-19 DIAGNOSIS — R630 Anorexia: Secondary | ICD-10-CM | POA: Diagnosis not present

## 2022-11-19 DIAGNOSIS — F0390 Unspecified dementia without behavioral disturbance: Secondary | ICD-10-CM | POA: Diagnosis not present

## 2022-11-19 DIAGNOSIS — R634 Abnormal weight loss: Secondary | ICD-10-CM | POA: Diagnosis not present

## 2022-12-09 DIAGNOSIS — F0390 Unspecified dementia without behavioral disturbance: Secondary | ICD-10-CM | POA: Diagnosis not present

## 2022-12-09 DIAGNOSIS — R1310 Dysphagia, unspecified: Secondary | ICD-10-CM | POA: Diagnosis not present

## 2022-12-15 DIAGNOSIS — G4700 Insomnia, unspecified: Secondary | ICD-10-CM

## 2022-12-15 DIAGNOSIS — F0390 Unspecified dementia without behavioral disturbance: Secondary | ICD-10-CM

## 2022-12-17 DIAGNOSIS — R634 Abnormal weight loss: Secondary | ICD-10-CM

## 2022-12-17 DIAGNOSIS — F0390 Unspecified dementia without behavioral disturbance: Secondary | ICD-10-CM

## 2022-12-20 DIAGNOSIS — Z87440 Personal history of urinary (tract) infections: Secondary | ICD-10-CM | POA: Diagnosis not present

## 2022-12-20 DIAGNOSIS — F0390 Unspecified dementia without behavioral disturbance: Secondary | ICD-10-CM | POA: Diagnosis not present

## 2022-12-20 DIAGNOSIS — B9620 Unspecified Escherichia coli [E. coli] as the cause of diseases classified elsewhere: Secondary | ICD-10-CM | POA: Diagnosis not present

## 2022-12-20 DIAGNOSIS — N390 Urinary tract infection, site not specified: Secondary | ICD-10-CM | POA: Diagnosis not present

## 2022-12-27 DIAGNOSIS — R1310 Dysphagia, unspecified: Secondary | ICD-10-CM | POA: Diagnosis not present

## 2022-12-27 DIAGNOSIS — F0390 Unspecified dementia without behavioral disturbance: Secondary | ICD-10-CM | POA: Diagnosis not present

## 2023-01-12 DIAGNOSIS — S00511A Abrasion of lip, initial encounter: Secondary | ICD-10-CM

## 2023-01-12 DIAGNOSIS — M6281 Muscle weakness (generalized): Secondary | ICD-10-CM

## 2023-01-12 DIAGNOSIS — R2689 Other abnormalities of gait and mobility: Secondary | ICD-10-CM

## 2023-01-12 DIAGNOSIS — Z9181 History of falling: Secondary | ICD-10-CM

## 2023-01-12 DIAGNOSIS — W19XXXA Unspecified fall, initial encounter: Secondary | ICD-10-CM

## 2023-01-12 DIAGNOSIS — S0083XA Contusion of other part of head, initial encounter: Secondary | ICD-10-CM

## 2023-01-12 DIAGNOSIS — S01402A Unspecified open wound of left cheek and temporomandibular area, initial encounter: Secondary | ICD-10-CM

## 2023-01-12 DIAGNOSIS — F0390 Unspecified dementia without behavioral disturbance: Secondary | ICD-10-CM

## 2023-02-02 DIAGNOSIS — W19XXXA Unspecified fall, initial encounter: Secondary | ICD-10-CM

## 2023-02-02 DIAGNOSIS — F0390 Unspecified dementia without behavioral disturbance: Secondary | ICD-10-CM

## 2023-02-02 DIAGNOSIS — S0083XA Contusion of other part of head, initial encounter: Secondary | ICD-10-CM | POA: Diagnosis not present

## 2023-02-02 DIAGNOSIS — S60222A Contusion of left hand, initial encounter: Secondary | ICD-10-CM | POA: Diagnosis not present

## 2023-02-02 DIAGNOSIS — Z9181 History of falling: Secondary | ICD-10-CM

## 2023-02-02 DIAGNOSIS — R2689 Other abnormalities of gait and mobility: Secondary | ICD-10-CM | POA: Diagnosis not present

## 2023-02-02 DIAGNOSIS — M6281 Muscle weakness (generalized): Secondary | ICD-10-CM | POA: Diagnosis not present

## 2023-03-02 DIAGNOSIS — M1990 Unspecified osteoarthritis, unspecified site: Secondary | ICD-10-CM | POA: Diagnosis not present

## 2023-03-02 DIAGNOSIS — F0390 Unspecified dementia without behavioral disturbance: Secondary | ICD-10-CM

## 2023-03-02 DIAGNOSIS — G4700 Insomnia, unspecified: Secondary | ICD-10-CM | POA: Diagnosis not present

## 2023-03-02 DIAGNOSIS — R1310 Dysphagia, unspecified: Secondary | ICD-10-CM | POA: Diagnosis not present

## 2023-03-02 DIAGNOSIS — M810 Age-related osteoporosis without current pathological fracture: Secondary | ICD-10-CM | POA: Diagnosis not present

## 2023-03-04 DIAGNOSIS — F0390 Unspecified dementia without behavioral disturbance: Secondary | ICD-10-CM | POA: Diagnosis not present

## 2023-03-04 DIAGNOSIS — D649 Anemia, unspecified: Secondary | ICD-10-CM | POA: Diagnosis not present

## 2023-03-04 DIAGNOSIS — E871 Hypo-osmolality and hyponatremia: Secondary | ICD-10-CM | POA: Diagnosis not present

## 2023-03-25 DIAGNOSIS — Z23 Encounter for immunization: Secondary | ICD-10-CM | POA: Diagnosis not present

## 2023-03-28 DIAGNOSIS — L84 Corns and callosities: Secondary | ICD-10-CM

## 2023-03-28 DIAGNOSIS — M79622 Pain in left upper arm: Secondary | ICD-10-CM

## 2023-03-28 DIAGNOSIS — F0390 Unspecified dementia without behavioral disturbance: Secondary | ICD-10-CM

## 2023-03-28 DIAGNOSIS — B351 Tinea unguium: Secondary | ICD-10-CM

## 2023-04-20 DIAGNOSIS — F0390 Unspecified dementia without behavioral disturbance: Secondary | ICD-10-CM | POA: Diagnosis not present

## 2023-04-20 DIAGNOSIS — R635 Abnormal weight gain: Secondary | ICD-10-CM | POA: Diagnosis not present

## 2023-04-20 DIAGNOSIS — R600 Localized edema: Secondary | ICD-10-CM | POA: Diagnosis not present

## 2023-04-21 DIAGNOSIS — M6281 Muscle weakness (generalized): Secondary | ICD-10-CM | POA: Diagnosis not present

## 2023-04-21 DIAGNOSIS — F0390 Unspecified dementia without behavioral disturbance: Secondary | ICD-10-CM

## 2023-04-21 DIAGNOSIS — L84 Corns and callosities: Secondary | ICD-10-CM | POA: Diagnosis not present

## 2023-04-21 DIAGNOSIS — M79675 Pain in left toe(s): Secondary | ICD-10-CM | POA: Diagnosis not present

## 2023-04-21 DIAGNOSIS — B351 Tinea unguium: Secondary | ICD-10-CM | POA: Diagnosis not present

## 2023-04-21 DIAGNOSIS — R269 Unspecified abnormalities of gait and mobility: Secondary | ICD-10-CM

## 2023-05-13 DIAGNOSIS — F419 Anxiety disorder, unspecified: Secondary | ICD-10-CM | POA: Diagnosis not present

## 2023-05-13 DIAGNOSIS — R451 Restlessness and agitation: Secondary | ICD-10-CM | POA: Diagnosis not present

## 2023-05-13 DIAGNOSIS — F0390 Unspecified dementia without behavioral disturbance: Secondary | ICD-10-CM | POA: Diagnosis not present

## 2023-06-03 DIAGNOSIS — M25511 Pain in right shoulder: Secondary | ICD-10-CM | POA: Diagnosis not present

## 2023-06-03 DIAGNOSIS — M6281 Muscle weakness (generalized): Secondary | ICD-10-CM | POA: Diagnosis not present

## 2023-06-03 DIAGNOSIS — Z9181 History of falling: Secondary | ICD-10-CM | POA: Diagnosis not present

## 2023-06-03 DIAGNOSIS — G894 Chronic pain syndrome: Secondary | ICD-10-CM | POA: Diagnosis not present

## 2023-06-06 DIAGNOSIS — M6281 Muscle weakness (generalized): Secondary | ICD-10-CM | POA: Diagnosis not present

## 2023-06-06 DIAGNOSIS — R000 Tachycardia, unspecified: Secondary | ICD-10-CM | POA: Diagnosis not present

## 2023-06-06 DIAGNOSIS — R4781 Slurred speech: Secondary | ICD-10-CM | POA: Diagnosis not present

## 2023-06-06 DIAGNOSIS — F0390 Unspecified dementia without behavioral disturbance: Secondary | ICD-10-CM

## 2023-06-06 DIAGNOSIS — N390 Urinary tract infection, site not specified: Secondary | ICD-10-CM | POA: Diagnosis not present

## 2023-06-08 DIAGNOSIS — G894 Chronic pain syndrome: Secondary | ICD-10-CM | POA: Diagnosis not present

## 2023-06-08 DIAGNOSIS — M25511 Pain in right shoulder: Secondary | ICD-10-CM | POA: Diagnosis not present

## 2023-06-08 DIAGNOSIS — F0390 Unspecified dementia without behavioral disturbance: Secondary | ICD-10-CM | POA: Diagnosis not present

## 2023-06-08 DIAGNOSIS — M6281 Muscle weakness (generalized): Secondary | ICD-10-CM | POA: Diagnosis not present

## 2023-06-13 DIAGNOSIS — R2689 Other abnormalities of gait and mobility: Secondary | ICD-10-CM | POA: Diagnosis not present

## 2023-06-13 DIAGNOSIS — M6281 Muscle weakness (generalized): Secondary | ICD-10-CM | POA: Diagnosis not present

## 2023-06-13 DIAGNOSIS — M25511 Pain in right shoulder: Secondary | ICD-10-CM | POA: Diagnosis not present

## 2023-06-13 DIAGNOSIS — F0390 Unspecified dementia without behavioral disturbance: Secondary | ICD-10-CM | POA: Diagnosis not present

## 2023-06-16 DIAGNOSIS — R634 Abnormal weight loss: Secondary | ICD-10-CM | POA: Diagnosis not present

## 2023-06-16 DIAGNOSIS — R630 Anorexia: Secondary | ICD-10-CM | POA: Diagnosis not present

## 2023-06-16 DIAGNOSIS — M6281 Muscle weakness (generalized): Secondary | ICD-10-CM | POA: Diagnosis not present

## 2023-06-16 DIAGNOSIS — F0390 Unspecified dementia without behavioral disturbance: Secondary | ICD-10-CM | POA: Diagnosis not present

## 2023-07-15 DIAGNOSIS — R634 Abnormal weight loss: Secondary | ICD-10-CM | POA: Diagnosis not present

## 2023-07-15 DIAGNOSIS — F0390 Unspecified dementia without behavioral disturbance: Secondary | ICD-10-CM | POA: Diagnosis not present

## 2023-07-15 DIAGNOSIS — Z87440 Personal history of urinary (tract) infections: Secondary | ICD-10-CM | POA: Diagnosis not present

## 2023-07-25 DIAGNOSIS — S0083XA Contusion of other part of head, initial encounter: Secondary | ICD-10-CM | POA: Diagnosis not present

## 2023-07-25 DIAGNOSIS — S8012XA Contusion of left lower leg, initial encounter: Secondary | ICD-10-CM | POA: Diagnosis not present

## 2023-07-25 DIAGNOSIS — Z9181 History of falling: Secondary | ICD-10-CM | POA: Diagnosis not present

## 2023-07-25 DIAGNOSIS — M6281 Muscle weakness (generalized): Secondary | ICD-10-CM | POA: Diagnosis not present

## 2023-07-27 DIAGNOSIS — R52 Pain, unspecified: Secondary | ICD-10-CM | POA: Diagnosis not present

## 2023-07-27 DIAGNOSIS — M1990 Unspecified osteoarthritis, unspecified site: Secondary | ICD-10-CM | POA: Diagnosis not present

## 2023-07-27 DIAGNOSIS — G894 Chronic pain syndrome: Secondary | ICD-10-CM

## 2023-07-27 DIAGNOSIS — F03918 Unspecified dementia, unspecified severity, with other behavioral disturbance: Secondary | ICD-10-CM

## 2023-07-27 DIAGNOSIS — M810 Age-related osteoporosis without current pathological fracture: Secondary | ICD-10-CM | POA: Diagnosis not present

## 2023-07-27 DIAGNOSIS — M6281 Muscle weakness (generalized): Secondary | ICD-10-CM | POA: Diagnosis not present

## 2023-08-03 DIAGNOSIS — R300 Dysuria: Secondary | ICD-10-CM | POA: Diagnosis not present

## 2023-08-03 DIAGNOSIS — F0390 Unspecified dementia without behavioral disturbance: Secondary | ICD-10-CM | POA: Diagnosis not present

## 2023-08-03 DIAGNOSIS — R5381 Other malaise: Secondary | ICD-10-CM | POA: Diagnosis not present

## 2023-08-03 DIAGNOSIS — Z87440 Personal history of urinary (tract) infections: Secondary | ICD-10-CM

## 2023-08-03 DIAGNOSIS — R630 Anorexia: Secondary | ICD-10-CM | POA: Diagnosis not present

## 2023-08-04 DIAGNOSIS — F0390 Unspecified dementia without behavioral disturbance: Secondary | ICD-10-CM

## 2023-08-04 DIAGNOSIS — R627 Adult failure to thrive: Secondary | ICD-10-CM

## 2023-08-04 DIAGNOSIS — R634 Abnormal weight loss: Secondary | ICD-10-CM

## 2023-08-04 DIAGNOSIS — G894 Chronic pain syndrome: Secondary | ICD-10-CM

## 2023-08-04 DIAGNOSIS — R630 Anorexia: Secondary | ICD-10-CM

## 2023-08-20 DEATH — deceased
